# Patient Record
Sex: Male | Born: 1940
Health system: Southern US, Community
[De-identification: ages and names within clinical notes are randomized; demographics above are authoritative.]

## PROBLEM LIST (undated history)

## (undated) DIAGNOSIS — J449 Chronic obstructive pulmonary disease, unspecified: Secondary | ICD-10-CM

## (undated) DIAGNOSIS — I10 Essential (primary) hypertension: Secondary | ICD-10-CM

## (undated) DIAGNOSIS — N289 Disorder of kidney and ureter, unspecified: Secondary | ICD-10-CM

## (undated) DIAGNOSIS — C801 Malignant (primary) neoplasm, unspecified: Secondary | ICD-10-CM

## (undated) DIAGNOSIS — E785 Hyperlipidemia, unspecified: Secondary | ICD-10-CM

## (undated) DIAGNOSIS — K5790 Diverticulosis of intestine, part unspecified, without perforation or abscess without bleeding: Secondary | ICD-10-CM

## (undated) DIAGNOSIS — N200 Calculus of kidney: Secondary | ICD-10-CM

## (undated) DIAGNOSIS — C4490 Unspecified malignant neoplasm of skin, unspecified: Secondary | ICD-10-CM

## (undated) DIAGNOSIS — M1990 Unspecified osteoarthritis, unspecified site: Secondary | ICD-10-CM

## (undated) HISTORY — PX: SKIN BIOPSY: SHX1

## (undated) HISTORY — DX: Diverticulosis of intestine, part unspecified, without perforation or abscess without bleeding: K57.90

## (undated) HISTORY — PX: SKIN GRAFT: SHX250

## (undated) HISTORY — PX: HERNIA REPAIR: SHX51

## (undated) HISTORY — PX: PROSTATE ABLATION: SHX6042

---

## 1998-02-05 ENCOUNTER — Encounter: Admission: RE | Admit: 1998-02-05 | Discharge: 1998-02-05 | Payer: Self-pay | Admitting: Family Medicine

## 2001-12-06 ENCOUNTER — Encounter: Admission: RE | Admit: 2001-12-06 | Discharge: 2001-12-06 | Payer: Self-pay | Admitting: Family Medicine

## 2001-12-19 ENCOUNTER — Encounter: Admission: RE | Admit: 2001-12-19 | Discharge: 2001-12-19 | Payer: Self-pay | Admitting: Family Medicine

## 2002-03-01 ENCOUNTER — Encounter: Admission: RE | Admit: 2002-03-01 | Discharge: 2002-03-01 | Payer: Self-pay | Admitting: Family Medicine

## 2002-04-19 ENCOUNTER — Encounter: Admission: RE | Admit: 2002-04-19 | Discharge: 2002-04-19 | Payer: Self-pay | Admitting: Family Medicine

## 2002-04-30 ENCOUNTER — Encounter: Admission: RE | Admit: 2002-04-30 | Discharge: 2002-04-30 | Payer: Self-pay | Admitting: Sports Medicine

## 2002-06-11 ENCOUNTER — Encounter: Admission: RE | Admit: 2002-06-11 | Discharge: 2002-06-11 | Payer: Self-pay | Admitting: Sports Medicine

## 2002-07-19 ENCOUNTER — Encounter: Admission: RE | Admit: 2002-07-19 | Discharge: 2002-07-19 | Payer: Self-pay | Admitting: Family Medicine

## 2002-07-24 ENCOUNTER — Ambulatory Visit (HOSPITAL_COMMUNITY): Admission: RE | Admit: 2002-07-24 | Discharge: 2002-07-24 | Payer: Self-pay | Admitting: Family Medicine

## 2002-07-24 ENCOUNTER — Encounter: Payer: Self-pay | Admitting: Family Medicine

## 2002-09-12 ENCOUNTER — Encounter: Admission: RE | Admit: 2002-09-12 | Discharge: 2002-09-12 | Payer: Self-pay | Admitting: Family Medicine

## 2002-10-21 ENCOUNTER — Encounter: Admission: RE | Admit: 2002-10-21 | Discharge: 2002-10-21 | Payer: Self-pay | Admitting: Family Medicine

## 2003-01-16 ENCOUNTER — Encounter: Admission: RE | Admit: 2003-01-16 | Discharge: 2003-01-16 | Payer: Self-pay | Admitting: Family Medicine

## 2003-03-07 ENCOUNTER — Encounter: Admission: RE | Admit: 2003-03-07 | Discharge: 2003-03-07 | Payer: Self-pay | Admitting: Family Medicine

## 2003-07-29 ENCOUNTER — Ambulatory Visit (HOSPITAL_COMMUNITY): Admission: RE | Admit: 2003-07-29 | Discharge: 2003-07-29 | Payer: Self-pay | Admitting: Family Medicine

## 2003-07-29 ENCOUNTER — Encounter: Admission: RE | Admit: 2003-07-29 | Discharge: 2003-07-29 | Payer: Self-pay | Admitting: Family Medicine

## 2003-08-22 ENCOUNTER — Encounter: Admission: RE | Admit: 2003-08-22 | Discharge: 2003-08-22 | Payer: Self-pay | Admitting: Family Medicine

## 2003-08-27 ENCOUNTER — Ambulatory Visit (HOSPITAL_COMMUNITY): Admission: RE | Admit: 2003-08-27 | Discharge: 2003-08-27 | Payer: Self-pay | Admitting: Sports Medicine

## 2003-08-27 ENCOUNTER — Encounter: Admission: RE | Admit: 2003-08-27 | Discharge: 2003-08-27 | Payer: Self-pay | Admitting: Family Medicine

## 2003-09-03 ENCOUNTER — Encounter: Admission: RE | Admit: 2003-09-03 | Discharge: 2003-09-03 | Payer: Self-pay | Admitting: Family Medicine

## 2003-10-24 ENCOUNTER — Emergency Department (HOSPITAL_COMMUNITY): Admission: EM | Admit: 2003-10-24 | Discharge: 2003-10-24 | Payer: Self-pay | Admitting: Family Medicine

## 2004-02-02 ENCOUNTER — Ambulatory Visit: Payer: Self-pay | Admitting: Sports Medicine

## 2005-01-15 ENCOUNTER — Emergency Department (HOSPITAL_COMMUNITY): Admission: EM | Admit: 2005-01-15 | Discharge: 2005-01-15 | Payer: Self-pay | Admitting: Family Medicine

## 2005-01-22 ENCOUNTER — Emergency Department (HOSPITAL_COMMUNITY): Admission: EM | Admit: 2005-01-22 | Discharge: 2005-01-22 | Payer: Self-pay | Admitting: Family Medicine

## 2006-09-23 ENCOUNTER — Emergency Department (HOSPITAL_COMMUNITY): Admission: EM | Admit: 2006-09-23 | Discharge: 2006-09-23 | Payer: Self-pay | Admitting: Emergency Medicine

## 2007-05-27 ENCOUNTER — Emergency Department (HOSPITAL_COMMUNITY): Admission: EM | Admit: 2007-05-27 | Discharge: 2007-05-27 | Payer: Self-pay | Admitting: Family Medicine

## 2008-03-22 ENCOUNTER — Emergency Department (HOSPITAL_BASED_OUTPATIENT_CLINIC_OR_DEPARTMENT_OTHER): Admission: EM | Admit: 2008-03-22 | Discharge: 2008-03-22 | Payer: Self-pay | Admitting: Emergency Medicine

## 2008-07-19 ENCOUNTER — Emergency Department (HOSPITAL_BASED_OUTPATIENT_CLINIC_OR_DEPARTMENT_OTHER): Admission: EM | Admit: 2008-07-19 | Discharge: 2008-07-19 | Payer: Self-pay | Admitting: Emergency Medicine

## 2009-03-08 ENCOUNTER — Emergency Department (HOSPITAL_BASED_OUTPATIENT_CLINIC_OR_DEPARTMENT_OTHER): Admission: EM | Admit: 2009-03-08 | Discharge: 2009-03-08 | Payer: Self-pay | Admitting: Emergency Medicine

## 2009-03-08 ENCOUNTER — Ambulatory Visit: Payer: Self-pay | Admitting: Diagnostic Radiology

## 2009-03-09 ENCOUNTER — Ambulatory Visit: Payer: Self-pay | Admitting: Radiology

## 2009-03-09 ENCOUNTER — Emergency Department (HOSPITAL_BASED_OUTPATIENT_CLINIC_OR_DEPARTMENT_OTHER): Admission: EM | Admit: 2009-03-09 | Discharge: 2009-03-09 | Payer: Self-pay | Admitting: Emergency Medicine

## 2009-12-21 ENCOUNTER — Emergency Department (HOSPITAL_BASED_OUTPATIENT_CLINIC_OR_DEPARTMENT_OTHER)
Admission: EM | Admit: 2009-12-21 | Discharge: 2009-12-22 | Payer: Self-pay | Source: Home / Self Care | Admitting: Emergency Medicine

## 2010-03-30 LAB — CBC
HCT: 37.6 % — ABNORMAL LOW (ref 39.0–52.0)
Platelets: 136 10*3/uL — ABNORMAL LOW (ref 150–400)
RBC: 4.19 MIL/uL — ABNORMAL LOW (ref 4.22–5.81)
RDW: 12.4 % (ref 11.5–15.5)
WBC: 3.1 10*3/uL — ABNORMAL LOW (ref 4.0–10.5)

## 2010-03-30 LAB — DIFFERENTIAL
Basophils Absolute: 0 10*3/uL (ref 0.0–0.1)
Lymphocytes Relative: 48 % — ABNORMAL HIGH (ref 12–46)
Lymphs Abs: 1.5 10*3/uL (ref 0.7–4.0)
Monocytes Absolute: 0.4 10*3/uL (ref 0.1–1.0)
Neutro Abs: 1.1 10*3/uL — ABNORMAL LOW (ref 1.7–7.7)

## 2010-03-30 LAB — BASIC METABOLIC PANEL
BUN: 13 mg/dL (ref 6–23)
GFR calc Af Amer: >60 mL/min (ref >60)
GFR calc non Af Amer: >60 mL/min (ref >60)
Potassium: 3.7 mEq/L (ref 3.5–5.1)

## 2010-04-07 LAB — CBC
HCT: 38.7 % — ABNORMAL LOW (ref 39.0–52.0)
Hemoglobin: 13.6 g/dL (ref 13.0–17.0)
WBC: 4.6 10*3/uL (ref 4.0–10.5)

## 2010-04-07 LAB — DIFFERENTIAL
Basophils Absolute: 0 10*3/uL (ref 0.0–0.1)
Basophils Relative: 0 % (ref 0–1)
Monocytes Absolute: 0.6 10*3/uL (ref 0.1–1.0)
Neutro Abs: 3.3 10*3/uL (ref 1.7–7.7)
Neutrophils Relative %: 72 % (ref 43–77)

## 2010-04-07 LAB — COMPREHENSIVE METABOLIC PANEL
Alkaline Phosphatase: 49 U/L (ref 39–117)
BUN: 15 mg/dL (ref 6–23)
Chloride: 104 mEq/L (ref 96–112)
GFR calc non Af Amer: >60 mL/min (ref >60)
Glucose, Bld: 115 mg/dL — ABNORMAL HIGH (ref 70–99)
Potassium: 3.2 mEq/L — ABNORMAL LOW (ref 3.5–5.1)
Total Bilirubin: 0.8 mg/dL (ref 0.3–1.2)

## 2010-04-07 LAB — URINALYSIS, ROUTINE W REFLEX MICROSCOPIC
Protein, ur: 30 mg/dL — AB
Urobilinogen, UA: 1 mg/dL (ref 0.0–1.0)

## 2010-04-07 LAB — URINE MICROSCOPIC-ADD ON

## 2010-10-29 LAB — CBC
Hemoglobin: 14.7
MCHC: 34.7
RBC: 4.59
WBC: 5.4

## 2010-10-29 LAB — DIFFERENTIAL
Lymphocytes Relative: 38
Lymphs Abs: 2.1
Monocytes Absolute: 0.4
Monocytes Relative: 8
Neutro Abs: 2.8

## 2010-10-29 LAB — POCT URINALYSIS DIP (DEVICE)
Glucose, UA: NEGATIVE
Ketones, ur: NEGATIVE
Operator id: 235561
Specific Gravity, Urine: 1.02

## 2010-10-29 LAB — I-STAT 8, (EC8 V) (CONVERTED LAB)
BUN: 11
Chloride: 104
Hemoglobin: 15.6
Operator id: 235561
pCO2, Ven: 46.9

## 2012-05-11 ENCOUNTER — Emergency Department (HOSPITAL_BASED_OUTPATIENT_CLINIC_OR_DEPARTMENT_OTHER)
Admission: EM | Admit: 2012-05-11 | Discharge: 2012-05-11 | Disposition: A | Discharge status: Home / Self Care | Payer: Medicare Other | Attending: Emergency Medicine | Admitting: Emergency Medicine

## 2012-05-11 ENCOUNTER — Encounter (HOSPITAL_BASED_OUTPATIENT_CLINIC_OR_DEPARTMENT_OTHER): Payer: Self-pay | Admitting: Emergency Medicine

## 2012-05-11 ENCOUNTER — Ambulatory Visit (HOSPITAL_BASED_OUTPATIENT_CLINIC_OR_DEPARTMENT_OTHER)
Admit: 2012-05-11 | Discharge: 2012-05-11 | Disposition: A | Discharge status: Home / Self Care | Payer: Medicare Other | Source: Ambulatory Visit | Attending: Emergency Medicine | Admitting: Emergency Medicine

## 2012-05-11 ENCOUNTER — Other Ambulatory Visit (HOSPITAL_BASED_OUTPATIENT_CLINIC_OR_DEPARTMENT_OTHER): Payer: Self-pay | Admitting: Emergency Medicine

## 2012-05-11 DIAGNOSIS — Z87448 Personal history of other diseases of urinary system: Secondary | ICD-10-CM | POA: Insufficient documentation

## 2012-05-11 DIAGNOSIS — R9389 Abnormal findings on diagnostic imaging of other specified body structures: Secondary | ICD-10-CM | POA: Insufficient documentation

## 2012-05-11 DIAGNOSIS — R222 Localized swelling, mass and lump, trunk: Secondary | ICD-10-CM | POA: Insufficient documentation

## 2012-05-11 DIAGNOSIS — Z85828 Personal history of other malignant neoplasm of skin: Secondary | ICD-10-CM | POA: Insufficient documentation

## 2012-05-11 DIAGNOSIS — Z8739 Personal history of other diseases of the musculoskeletal system and connective tissue: Secondary | ICD-10-CM | POA: Insufficient documentation

## 2012-05-11 DIAGNOSIS — Z87442 Personal history of urinary calculi: Secondary | ICD-10-CM | POA: Insufficient documentation

## 2012-05-11 DIAGNOSIS — Z8639 Personal history of other endocrine, nutritional and metabolic disease: Secondary | ICD-10-CM | POA: Insufficient documentation

## 2012-05-11 DIAGNOSIS — I10 Essential (primary) hypertension: Secondary | ICD-10-CM | POA: Insufficient documentation

## 2012-05-11 DIAGNOSIS — Z862 Personal history of diseases of the blood and blood-forming organs and certain disorders involving the immune mechanism: Secondary | ICD-10-CM | POA: Insufficient documentation

## 2012-05-11 HISTORY — DX: Disorder of kidney and ureter, unspecified: N28.9

## 2012-05-11 HISTORY — DX: Unspecified osteoarthritis, unspecified site: M19.90

## 2012-05-11 HISTORY — DX: Malignant (primary) neoplasm, unspecified: C80.1

## 2012-05-11 HISTORY — DX: Hyperlipidemia, unspecified: E78.5

## 2012-05-11 HISTORY — DX: Calculus of kidney: N20.0

## 2012-05-11 HISTORY — DX: Unspecified malignant neoplasm of skin, unspecified: C44.90

## 2012-05-11 HISTORY — DX: Essential (primary) hypertension: I10

## 2012-05-11 NOTE — ED Provider Notes (Signed)
History     CSN: [id number]  Arrival date & time 05/11/12  0056   First MD Initiated Contact with Patient 05/11/12 0253      Chief Complaint  Patient presents with  . Lump on Back     (Consider location/radiation/quality/duration/timing/severity/associated sxs/prior treatment) HPI 72 year old male who discovered a knot beneath his left scapula yesterday. He is not sure if it just arose or if he just discovered it. The knot can be pushed up underneath his scapula and then by extending his left shoulder can be popped down below his scapula. It is not tender. It is firm and round. It is about the size of a golf ball. There is no associated chest pain or shortness of breath.  Past Medical History  Diagnosis Date  . Hypertension   . Hyperlipidemia   . Cancer   . Skin cancer   . Arthritis   . Osteoarthritis   . Renal disorder   . Kidney stones     Past Surgical History  Procedure Laterality Date  . Skin biopsy    . Hernia repair    . Prostate ablation      History reviewed. No pertinent family history.  History  Substance Use Topics  . Smoking status: Not on file  . Smokeless tobacco: Not on file  . Alcohol Use: No      Review of Systems  All other systems reviewed and are negative.    Allergies  Pneumococcal vaccines; Sulfa antibiotics; and Aspirin  Home Medications  No current outpatient prescriptions on file.  BP 144/96  Pulse 102  Temp(Src) 98.1 F (36.7 C) (Oral)  Resp 14  Ht 6\' 1"  (1.854 m)  Wt 187 lb (84.823 kg)  BMI 24.68 kg/m2  SpO2 97%  Physical Exam General: Well-developed, well-nourished male in no acute distress; appearance consistent with age of record HENT: normocephalic, atraumatic Eyes: pupils equal round and reactive to light; extraocular muscles intact Neck: supple Heart: regular rate and rhythm Lungs: clear to auscultation bilaterally Abdomen: soft; nondistended Back: Firm, near spherical, nontender mass inferior to left  scapula; mass can be manually "popped" out behind the scapula; with extension of the left arm at the shoulder the mass can be "popped" back down inferior to the scapula; mass is roughly the size of a golf ball Extremities: No deformity; full range of motion Neurologic: Awake, alert and oriented; motor function intact in all extremities and symmetric; no facial droop Skin: Warm and dry Psychiatric: Normal mood and affect    ED Course  Procedures (including critical care time)     MDM  Radiologist recommends ultrasound evaluation as an outpatient.        Hanley Seamen, MD 05/11/12 [phone]

## 2012-05-11 NOTE — ED Notes (Signed)
Pt states he noticed a big knot on his back underneath his left shoulder. Pt state he does not have any pain with the lump/knot.

## 2012-05-11 NOTE — ED Provider Notes (Signed)
Pt returned for ultrasound.  Ultrasound show 4.2 cm probable cystic mass.    Pt given referral to general surgeon at [facility] [place] for further evaluation/excision.     Lonia Skinner Shafter, PA-C 05/11/12 [phone]

## 2012-05-16 ENCOUNTER — Ambulatory Visit (INDEPENDENT_AMBULATORY_CARE_PROVIDER_SITE_OTHER): Payer: Medicare Other | Admitting: General Surgery

## 2012-05-31 ENCOUNTER — Encounter (INDEPENDENT_AMBULATORY_CARE_PROVIDER_SITE_OTHER): Payer: Self-pay | Admitting: General Surgery

## 2013-06-23 ENCOUNTER — Encounter (HOSPITAL_BASED_OUTPATIENT_CLINIC_OR_DEPARTMENT_OTHER): Payer: Self-pay | Admitting: Emergency Medicine

## 2013-06-23 ENCOUNTER — Emergency Department (HOSPITAL_BASED_OUTPATIENT_CLINIC_OR_DEPARTMENT_OTHER)
Admission: EM | Admit: 2013-06-23 | Discharge: 2013-06-24 | Disposition: A | Discharge status: Home / Self Care | Payer: Medicare Other | Attending: Emergency Medicine | Admitting: Emergency Medicine

## 2013-06-23 ENCOUNTER — Emergency Department (HOSPITAL_BASED_OUTPATIENT_CLINIC_OR_DEPARTMENT_OTHER): Payer: Medicare Other

## 2013-06-23 DIAGNOSIS — Z8739 Personal history of other diseases of the musculoskeletal system and connective tissue: Secondary | ICD-10-CM | POA: Insufficient documentation

## 2013-06-23 DIAGNOSIS — R1013 Epigastric pain: Secondary | ICD-10-CM | POA: Insufficient documentation

## 2013-06-23 DIAGNOSIS — Z87891 Personal history of nicotine dependence: Secondary | ICD-10-CM | POA: Insufficient documentation

## 2013-06-23 DIAGNOSIS — R109 Unspecified abdominal pain: Secondary | ICD-10-CM

## 2013-06-23 DIAGNOSIS — Z87442 Personal history of urinary calculi: Secondary | ICD-10-CM | POA: Insufficient documentation

## 2013-06-23 DIAGNOSIS — R079 Chest pain, unspecified: Secondary | ICD-10-CM | POA: Insufficient documentation

## 2013-06-23 DIAGNOSIS — Z85828 Personal history of other malignant neoplasm of skin: Secondary | ICD-10-CM | POA: Insufficient documentation

## 2013-06-23 DIAGNOSIS — I10 Essential (primary) hypertension: Secondary | ICD-10-CM | POA: Insufficient documentation

## 2013-06-23 DIAGNOSIS — Z862 Personal history of diseases of the blood and blood-forming organs and certain disorders involving the immune mechanism: Secondary | ICD-10-CM | POA: Insufficient documentation

## 2013-06-23 DIAGNOSIS — Z87448 Personal history of other diseases of urinary system: Secondary | ICD-10-CM | POA: Insufficient documentation

## 2013-06-23 DIAGNOSIS — Z8639 Personal history of other endocrine, nutritional and metabolic disease: Secondary | ICD-10-CM | POA: Insufficient documentation

## 2013-06-23 DIAGNOSIS — R110 Nausea: Secondary | ICD-10-CM | POA: Insufficient documentation

## 2013-06-23 LAB — BASIC METABOLIC PANEL
BUN: 13 mg/dL (ref 6–23)
CALCIUM: 9.2 mg/dL (ref 8.4–10.5)
CO2: 24 mEq/L (ref 19–32)
Chloride: 105 mEq/L (ref 96–112)
Creatinine, Ser: 1 mg/dL (ref 0.50–1.35)
GFR calc Af Amer: 85 mL/min — ABNORMAL LOW (ref >90)
GFR, EST NON AFRICAN AMERICAN: 73 mL/min — AB (ref >90)
GLUCOSE: 106 mg/dL — AB (ref 70–99)
Potassium: 4.1 mEq/L (ref 3.7–5.3)
Sodium: 141 mEq/L (ref 137–147)

## 2013-06-23 LAB — TROPONIN I: Troponin I: <0.3 ng/mL (ref <0.30)

## 2013-06-23 LAB — URINALYSIS, ROUTINE W REFLEX MICROSCOPIC
Bilirubin Urine: NEGATIVE
GLUCOSE, UA: NEGATIVE mg/dL
Hgb urine dipstick: NEGATIVE
Ketones, ur: NEGATIVE mg/dL
Leukocytes, UA: NEGATIVE
Nitrite: NEGATIVE
PROTEIN: NEGATIVE mg/dL
Specific Gravity, Urine: 1.016 (ref 1.005–1.030)
UROBILINOGEN UA: 0.2 mg/dL (ref 0.0–1.0)
pH: 7.5 (ref 5.0–8.0)

## 2013-06-23 LAB — I-STAT CG4 LACTIC ACID, ED: Lactic Acid, Venous: 1.08 mmol/L (ref 0.5–2.2)

## 2013-06-23 LAB — CBC WITH DIFFERENTIAL/PLATELET
BASOS ABS: 0 10*3/uL (ref 0.0–0.1)
Basophils Relative: 1 % (ref 0–1)
EOS ABS: 0.2 10*3/uL (ref 0.0–0.7)
EOS PCT: 3 % (ref 0–5)
HEMATOCRIT: 40.7 % (ref 39.0–52.0)
Hemoglobin: 14.5 g/dL (ref 13.0–17.0)
LYMPHS PCT: 43 % (ref 12–46)
Lymphs Abs: 2.4 10*3/uL (ref 0.7–4.0)
MCH: 33.2 pg (ref 26.0–34.0)
MCHC: 35.6 g/dL (ref 30.0–36.0)
MCV: 93.1 fL (ref 78.0–100.0)
MONO ABS: 0.7 10*3/uL (ref 0.1–1.0)
Monocytes Relative: 12 % (ref 3–12)
Neutro Abs: 2.3 10*3/uL (ref 1.7–7.7)
Neutrophils Relative %: 41 % — ABNORMAL LOW (ref 43–77)
PLATELETS: 207 10*3/uL (ref 150–400)
RBC: 4.37 MIL/uL (ref 4.22–5.81)
RDW: 12.5 % (ref 11.5–15.5)
WBC: 5.6 10*3/uL (ref 4.0–10.5)

## 2013-06-23 MED ORDER — MORPHINE SULFATE 4 MG/ML IJ SOLN
4.0000 mg | Freq: Once | INTRAMUSCULAR | Status: AC
  Administered 2013-06-23: 4 mg via INTRAVENOUS
  Filled 2013-06-23: qty 1

## 2013-06-23 MED ORDER — GI COCKTAIL ~~LOC~~
30.0000 mL | Freq: Once | ORAL | Status: AC
  Administered 2013-06-23: 30 mL via ORAL
  Filled 2013-06-23: qty 30

## 2013-06-23 MED ORDER — ONDANSETRON HCL 4 MG/2ML IJ SOLN
4.0000 mg | Freq: Once | INTRAMUSCULAR | Status: AC
  Administered 2013-06-23: 4 mg via INTRAVENOUS
  Filled 2013-06-23: qty 2

## 2013-06-23 MED ORDER — IOHEXOL 300 MG/ML  SOLN
50.0000 mL | Freq: Once | INTRAMUSCULAR | Status: AC | PRN
  Administered 2013-06-23: 50 mL via ORAL

## 2013-06-23 MED ORDER — IOHEXOL 300 MG/ML  SOLN
100.0000 mL | Freq: Once | INTRAMUSCULAR | Status: AC | PRN
  Administered 2013-06-23: 100 mL via INTRAVENOUS

## 2013-06-23 NOTE — ED Notes (Signed)
C/o body aches, headache, abdominal pain, and feeling of bloating x two days.  Denies vomiting, diarrhea.  C/o some chest pain, nausea.

## 2013-06-23 NOTE — ED Provider Notes (Signed)
CSN: [id number]     Arrival date & time 06/23/13  2025 History   First MD Initiated Contact with Patient 06/23/13 2133     Chief Complaint  Patient presents with  . Abdominal Pain  . Chest Pain     (Consider location/radiation/quality/duration/timing/severity/associated sxs/prior Treatment) Patient is a 73 y.o. male presenting with abdominal pain and chest pain.  Abdominal Pain Pain location:  Epigastric Pain quality: sharp   Pain radiates to:  Does not radiate Pain severity:  Severe Onset quality:  Gradual Duration:  3 days Timing:  Constant Progression:  Unchanged Chronicity:  New Relieved by:  Nothing Worsened by:  Movement Ineffective treatments:  None tried Associated symptoms: chest pain and nausea   Associated symptoms: no constipation, no diarrhea and no vomiting   Chest Pain Associated symptoms: abdominal pain and nausea   Associated symptoms: not vomiting     Past Medical History  Diagnosis Date  . Hypertension   . Hyperlipidemia   . Cancer   . Skin cancer   . Arthritis   . Osteoarthritis   . Renal disorder   . Kidney stones    Past Surgical History  Procedure Laterality Date  . Skin biopsy    . Hernia repair    . Prostate ablation     No family history on file. History  Substance Use Topics  . Smoking status: Former Research scientist (life sciences)  . Smokeless tobacco: Not on file  . Alcohol Use: No    Review of Systems  Cardiovascular: Positive for chest pain.  Gastrointestinal: Positive for nausea and abdominal pain. Negative for vomiting, diarrhea and constipation.  All other systems reviewed and are negative.     Allergies  Pneumococcal vaccines; Sulfa antibiotics; and Aspirin  Home Medications   Prior to Admission medications   Not on File   BP 130/97  Pulse 92  Temp(Src) 97.6 F (36.4 C) (Oral)  Resp 20  Ht 6' (1.829 m)  Wt 196 lb (88.905 kg)  BMI 26.58 kg/m2  SpO2 100% Physical Exam  Nursing note and vitals reviewed. Constitutional: He is  oriented to person, place, and time. He appears well-developed and well-nourished. No distress.  HENT:  Head: Normocephalic and atraumatic.  Mouth/Throat: Oropharynx is clear and moist.  Eyes: Conjunctivae are normal. Pupils are equal, round, and reactive to light. No scleral icterus.  Neck: Neck supple.  Cardiovascular: Normal rate, regular rhythm, normal heart sounds and intact distal pulses.   No murmur heard. Pulmonary/Chest: Effort normal and breath sounds normal. No stridor. No respiratory distress. He has no wheezes. He has no rales.  Abdominal: Soft. He exhibits no distension. There is tenderness in the epigastric area. There is no rigidity, no rebound and no guarding.  Musculoskeletal: Normal range of motion. He exhibits no edema.  Neurological: He is alert and oriented to person, place, and time.  Skin: Skin is warm and dry. No rash noted.  Psychiatric: He has a normal mood and affect. His behavior is normal.    ED Course  Procedures (including critical care time) Labs Review Labs Reviewed  CBC WITH DIFFERENTIAL - Abnormal; Notable for the following:    Neutrophils Relative % 41 (*)    All other components within normal limits  BASIC METABOLIC PANEL - Abnormal; Notable for the following:    Glucose, Bld 106 (*)    GFR calc non Af Amer 73 (*)    GFR calc Af Amer 85 (*)    All other components within normal limits  URINALYSIS,  ROUTINE W REFLEX MICROSCOPIC  TROPONIN I  I-STAT CG4 LACTIC ACID, ED    Imaging Review Ct Abdomen Pelvis W Contrast  06/23/2013   CLINICAL DATA:  Body aches, headache, abdominal pain, bloating, chest pain, and nausea.  EXAM: CT ABDOMEN AND PELVIS WITH CONTRAST  TECHNIQUE: Multidetector CT imaging of the abdomen and pelvis was performed using the standard protocol following bolus administration of intravenous contrast.  CONTRAST:  125mL OMNIPAQUE IOHEXOL 300 MG/ML  SOLN  COMPARISON:  03/09/2009  FINDINGS: Patchy atelectasis or infiltration in the lung  bases.  Tiny sub cm low-attenuation lesions in the liver similar to previous study, likely representing cysts. The gallbladder, spleen, pancreas, adrenal glands, inferior vena cava, and retroperitoneal lymph nodes are unremarkable. There is parenchymal scarring in the midpole of the right kidney. No hydronephrosis in either kidney. Calcification of the abdominal aorta without aneurysm. Stomach and small bowel are not abnormally distended and without wall thickening. Diffusely stool-filled colon without distention. No free air or free fluid in the abdomen. Abdominal wall musculature appears intact.  Pelvis: The prostate gland is enlarged, measuring 5.9 x 5 cm. Bladder wall is not thickened. There is a diverticulum arising from the left posterior bladder wall. No free or loculated pelvic fluid collections. No significant pelvic lymphadenopathy. Appendix is normal. Diverticula in the sigmoid colon without evidence of diverticulitis. Decompression of the sigmoid colon is probably due to under distention. Degenerative changes in the lumbar spine. No destructive bone lesions appreciated.  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 Sigmoid colonic diverticulosis without inflammatory change. Stable appearance of low-attenuation liver lesions, likely cysts. No significant change since prior study.   Electronically Signed   By: Lucienne Capers M.D.   On: 06/23/2013 23:44   All radiology studies independently viewed by me.      EKG Interpretation   Date/Time:  Sunday June 23 2013 20:47:41 EDT Ventricular Rate:  87 PR Interval:  166 QRS Duration: 94 QT Interval:  370 QTC Calculation: 445 R Axis:   -9 Text Interpretation:  Sinus rhythm with marked sinus arrhythmia with  frequent Premature ventricular complexes Left anterior fasicular block No  significant change was found Confirmed by [facility]  MD, TREY ([id number]) on  06/23/2013 10:51:42 PM      MDM   Final diagnoses:   Abdominal pain    73 year old male with epigastric abdominal pain for the last 2-3 days. Well-appearing, nontoxic. Abdomen soft but tender in his epigastrium. He also endorses some malaise and some mild chest pain. I do not think this represents ACS given the duration of symptoms, negative EKG, and negative troponin. Remainder of his labs are unremarkable. Plan to check CT of his abdomen and pelvis.  Labs unremarkable.  CT unchanged from prior.  Pain better after morphine and GI cocktail.  Pain possibly secondary to gastritis.  Advised adding zantac.  Advised PCP and GI follow up.  Given return precautions.   Houston Siren III, MD 06/24/13 [phone]

## 2013-06-24 MED ORDER — RANITIDINE HCL 75 MG PO TABS: 75.0000 mg | ORAL_TABLET | Freq: Two times a day (BID) | ORAL | Status: DC

## 2013-06-24 NOTE — Discharge Instructions (Signed)
Abdominal Pain, Adult °Many things can cause abdominal pain. Usually, abdominal pain is not caused by a disease and will improve without treatment. It can often be observed and treated at home. Your health care provider will do a physical exam and possibly order blood tests and X-rays to help determine the seriousness of your pain. However, in many cases, more time must pass before a clear cause of the pain can be found. Before that point, your health care provider may not know if you need more testing or further treatment. °HOME CARE INSTRUCTIONS  °Monitor your abdominal pain for any changes. The following actions may help to alleviate any discomfort you are experiencing: °· Only take over-the-counter or prescription medicines as directed by your health care provider. °· Do not take laxatives unless directed to do so by your health care provider. °· Try a clear liquid diet (broth, tea, or water) as directed by your health care provider. Slowly move to a bland diet as tolerated. °SEEK MEDICAL CARE IF: °· You have unexplained abdominal pain. °· You have abdominal pain associated with nausea or diarrhea. °· You have pain when you urinate or have a bowel movement. °· You experience abdominal pain that wakes you in the night. °· You have abdominal pain that is worsened or improved by eating food. °· You have abdominal pain that is worsened with eating fatty foods. °SEEK IMMEDIATE MEDICAL CARE IF:  °· Your pain does not go away within 2 hours. °· You have a fever. °· You keep throwing up (vomiting). °· Your pain is felt only in portions of the abdomen, such as the right side or the left lower portion of the abdomen. °· You pass bloody or black tarry stools. °MAKE SURE YOU: °· Understand these instructions.   °· Will watch your condition.   °· Will get help right away if you are not doing well or get worse.   °Document Released: 10/13/2004 Document Revised: 10/24/2012 Document Reviewed: 09/12/2012 °[employer]® Patient  Information ©2014 [employer]. ° °

## 2013-08-22 ENCOUNTER — Ambulatory Visit: Payer: Medicare Other | Admitting: Internal Medicine

## 2013-10-21 ENCOUNTER — Encounter (HOSPITAL_BASED_OUTPATIENT_CLINIC_OR_DEPARTMENT_OTHER): Payer: Self-pay | Admitting: Emergency Medicine

## 2013-10-21 ENCOUNTER — Emergency Department (HOSPITAL_BASED_OUTPATIENT_CLINIC_OR_DEPARTMENT_OTHER)
Admission: EM | Admit: 2013-10-21 | Discharge: 2013-10-21 | Discharge status: Against Medical Advice | Payer: Medicare Other | Attending: Emergency Medicine | Admitting: Emergency Medicine

## 2013-10-21 DIAGNOSIS — M549 Dorsalgia, unspecified: Secondary | ICD-10-CM | POA: Diagnosis present

## 2013-10-21 DIAGNOSIS — I10 Essential (primary) hypertension: Secondary | ICD-10-CM | POA: Diagnosis not present

## 2013-10-21 DIAGNOSIS — M545 Low back pain: Secondary | ICD-10-CM | POA: Insufficient documentation

## 2013-10-21 NOTE — ED Notes (Signed)
Pt c/o lower back pain which radiates won left leg x 2 days

## 2013-12-20 ENCOUNTER — Emergency Department (HOSPITAL_BASED_OUTPATIENT_CLINIC_OR_DEPARTMENT_OTHER)
Admission: EM | Admit: 2013-12-20 | Discharge: 2013-12-21 | Disposition: A | Discharge status: Home / Self Care | Payer: Medicare Other | Attending: Emergency Medicine | Admitting: Emergency Medicine

## 2013-12-20 ENCOUNTER — Encounter (HOSPITAL_BASED_OUTPATIENT_CLINIC_OR_DEPARTMENT_OTHER): Payer: Self-pay

## 2013-12-20 DIAGNOSIS — Z872 Personal history of diseases of the skin and subcutaneous tissue: Secondary | ICD-10-CM | POA: Insufficient documentation

## 2013-12-20 DIAGNOSIS — M79651 Pain in right thigh: Secondary | ICD-10-CM | POA: Diagnosis not present

## 2013-12-20 DIAGNOSIS — M79604 Pain in right leg: Secondary | ICD-10-CM | POA: Diagnosis present

## 2013-12-20 DIAGNOSIS — Z9889 Other specified postprocedural states: Secondary | ICD-10-CM | POA: Diagnosis not present

## 2013-12-20 DIAGNOSIS — Z79899 Other long term (current) drug therapy: Secondary | ICD-10-CM | POA: Insufficient documentation

## 2013-12-20 DIAGNOSIS — Z8719 Personal history of other diseases of the digestive system: Secondary | ICD-10-CM | POA: Insufficient documentation

## 2013-12-20 DIAGNOSIS — Z87448 Personal history of other diseases of urinary system: Secondary | ICD-10-CM | POA: Insufficient documentation

## 2013-12-20 DIAGNOSIS — Z87442 Personal history of urinary calculi: Secondary | ICD-10-CM | POA: Diagnosis not present

## 2013-12-20 DIAGNOSIS — Z87891 Personal history of nicotine dependence: Secondary | ICD-10-CM | POA: Insufficient documentation

## 2013-12-20 DIAGNOSIS — R21 Rash and other nonspecific skin eruption: Secondary | ICD-10-CM | POA: Diagnosis not present

## 2013-12-20 DIAGNOSIS — R1032 Left lower quadrant pain: Secondary | ICD-10-CM | POA: Diagnosis not present

## 2013-12-20 DIAGNOSIS — Z8639 Personal history of other endocrine, nutritional and metabolic disease: Secondary | ICD-10-CM | POA: Diagnosis not present

## 2013-12-20 DIAGNOSIS — I10 Essential (primary) hypertension: Secondary | ICD-10-CM | POA: Diagnosis not present

## 2013-12-20 DIAGNOSIS — Z85828 Personal history of other malignant neoplasm of skin: Secondary | ICD-10-CM | POA: Diagnosis not present

## 2013-12-20 NOTE — ED Notes (Signed)
Pt reports right leg pain for 1 week and L groin pain for 3-4 days.  Reports "it keeps giving out on me" but denies falls.

## 2013-12-21 DIAGNOSIS — M79651 Pain in right thigh: Secondary | ICD-10-CM | POA: Diagnosis not present

## 2013-12-21 MED ORDER — HYDROCODONE-ACETAMINOPHEN 5-325 MG PO TABS
1.0000 | ORAL_TABLET | Freq: Once | ORAL | Status: AC
  Administered 2013-12-21: 1 via ORAL

## 2013-12-21 MED ORDER — HYDROCODONE-ACETAMINOPHEN 5-325 MG PO TABS: 1.0000 | ORAL_TABLET | Freq: Four times a day (QID) | ORAL | Status: DC | PRN

## 2013-12-21 NOTE — ED Provider Notes (Signed)
CSN: [id number]     Arrival date & time 12/20/13  2154 History   First MD Initiated Contact with Patient 12/21/13 0111     Chief Complaint  Patient presents with  . Leg Pain     (Consider location/radiation/quality/duration/timing/severity/associated sxs/prior Treatment) HPI  This is a 73 year old male with one-week history of pain in his right side. The pain originates in about the location of his right greater trochanter and radiates down the right side of his thigh to his knee. He began as a burning sensation and then became a frank pain. He rates it an 8 out of 10. It is worse with movement or ambulation. He states he is had difficulty ambulating but has not actually fallen. He is also having some pain in his left groin. This pain is intermittent and worse with palpation. He has a history of right herniorrhaphy. He is also having a two-day history of erythematous plaques on his hands (dorsal and palmar) that break down and become painful. He has an appointment with his dermatologist on the 10th.  Past Medical History  Diagnosis Date  . Hypertension   . Hyperlipidemia   . Cancer   . Skin cancer   . Arthritis   . Osteoarthritis   . Renal disorder   . Kidney stones   . Diverticulosis    Past Surgical History  Procedure Laterality Date  . Skin biopsy    . Hernia repair    . Prostate ablation     No family history on file. History  Substance Use Topics  . Smoking status: Former Research scientist (life sciences)  . Smokeless tobacco: Not on file  . Alcohol Use: No    Review of Systems  All other systems reviewed and are negative.   Allergies  Pneumococcal vaccines; Sulfa antibiotics; and Aspirin  Home Medications   Prior to Admission medications   Medication Sig Start Date End Date Taking? Authorizing Provider  ranitidine (ZANTAC) 75 MG tablet Take 1 tablet (75 mg total) by mouth 2 (two) times daily. 06/24/13   Artis Delay, MD   BP 126/78 mmHg  Pulse 103  Temp(Src) 98.4 F (36.9 C) (Oral)   Resp 18  Ht 6\' 2"  (1.88 m)  Wt 200 lb (90.719 kg)  BMI 25.67 kg/m2  SpO2 98%   Physical Exam  General: Well-developed, well-nourished male in no acute distress; appearance consistent with age of record HENT: normocephalic; atraumatic; surgical changes to left side of face Eyes: pupils equal, round and reactive to light; extraocular muscles intact Neck: supple Heart: regular rate and rhythm Lungs: clear to auscultation bilaterally Abdomen: soft; nondistended; nontender; no masses or hepatosplenomegaly; bowel sounds present GU: Tanner 5 male, uncircumcised; no hernia palpated in lying or standing positions; tenderness on deep palpation of inguinal region Extremities: No deformity; full range of motion; pulses normal; tenderness of right greater trochanter and right lateral thigh Neurologic: Awake, alert and oriented; motor function intact in all extremities and symmetric; no facial droop Skin: Warm and dry; rash to hands as shown:   Psychiatric: Normal mood and affect    ED Course  Procedures (including critical care time)   MDM  Pain could represent trochanteric bursitis or iliotibial band syndrome on the right. The cause of left groin pain is not obvious. He could have a hernia that protrudes intermittently though known was palpated on exam.  Wynetta Fines, MD 12/21/13 [phone]

## 2014-04-05 ENCOUNTER — Emergency Department (HOSPITAL_BASED_OUTPATIENT_CLINIC_OR_DEPARTMENT_OTHER)
Admission: EM | Admit: 2014-04-05 | Discharge: 2014-04-05 | Discharge status: Against Medical Advice | Payer: Medicare Other | Attending: Emergency Medicine | Admitting: Emergency Medicine

## 2014-04-05 ENCOUNTER — Emergency Department (HOSPITAL_BASED_OUTPATIENT_CLINIC_OR_DEPARTMENT_OTHER): Payer: Medicare Other

## 2014-04-05 ENCOUNTER — Encounter (HOSPITAL_BASED_OUTPATIENT_CLINIC_OR_DEPARTMENT_OTHER): Payer: Self-pay | Admitting: Emergency Medicine

## 2014-04-05 DIAGNOSIS — Z87442 Personal history of urinary calculi: Secondary | ICD-10-CM | POA: Diagnosis not present

## 2014-04-05 DIAGNOSIS — I10 Essential (primary) hypertension: Secondary | ICD-10-CM | POA: Diagnosis not present

## 2014-04-05 DIAGNOSIS — R10819 Abdominal tenderness, unspecified site: Secondary | ICD-10-CM | POA: Diagnosis not present

## 2014-04-05 DIAGNOSIS — Z87891 Personal history of nicotine dependence: Secondary | ICD-10-CM | POA: Insufficient documentation

## 2014-04-05 DIAGNOSIS — Z8639 Personal history of other endocrine, nutritional and metabolic disease: Secondary | ICD-10-CM | POA: Insufficient documentation

## 2014-04-05 DIAGNOSIS — R079 Chest pain, unspecified: Secondary | ICD-10-CM | POA: Diagnosis present

## 2014-04-05 DIAGNOSIS — Z8719 Personal history of other diseases of the digestive system: Secondary | ICD-10-CM | POA: Insufficient documentation

## 2014-04-05 DIAGNOSIS — Z85828 Personal history of other malignant neoplasm of skin: Secondary | ICD-10-CM | POA: Diagnosis not present

## 2014-04-05 DIAGNOSIS — M1990 Unspecified osteoarthritis, unspecified site: Secondary | ICD-10-CM | POA: Diagnosis not present

## 2014-04-05 DIAGNOSIS — Z79899 Other long term (current) drug therapy: Secondary | ICD-10-CM | POA: Diagnosis not present

## 2014-04-05 LAB — TROPONIN I: Troponin I: <0.03 ng/mL (ref <0.031)

## 2014-04-05 LAB — CBC WITH DIFFERENTIAL/PLATELET
BASOS PCT: 0 % (ref 0–1)
Band Neutrophils: 1 % (ref 0–10)
Basophils Absolute: 0 10*3/uL (ref 0.0–0.1)
Eosinophils Absolute: 0.1 10*3/uL (ref 0.0–0.7)
Eosinophils Relative: 1 % (ref 0–5)
HCT: 42.3 % (ref 39.0–52.0)
HEMOGLOBIN: 14.4 g/dL (ref 13.0–17.0)
Lymphocytes Relative: 26 % (ref 12–46)
Lymphs Abs: 1.7 10*3/uL (ref 0.7–4.0)
MCH: 33.3 pg (ref 26.0–34.0)
MCHC: 34 g/dL (ref 30.0–36.0)
MCV: 97.9 fL (ref 78.0–100.0)
MONO ABS: 1.1 10*3/uL — AB (ref 0.1–1.0)
Monocytes Relative: 16 % — ABNORMAL HIGH (ref 3–12)
Neutro Abs: 3.8 10*3/uL (ref 1.7–7.7)
Neutrophils Relative %: 56 % (ref 43–77)
Platelets: 224 10*3/uL (ref 150–400)
RBC: 4.32 MIL/uL (ref 4.22–5.81)
RDW: 13 % (ref 11.5–15.5)
WBC: 6.7 10*3/uL (ref 4.0–10.5)

## 2014-04-05 LAB — COMPREHENSIVE METABOLIC PANEL
ALK PHOS: 55 U/L (ref 39–117)
ALT: 25 U/L (ref 0–53)
ANION GAP: 8 (ref 5–15)
AST: 32 U/L (ref 0–37)
Albumin: 4.1 g/dL (ref 3.5–5.2)
BILIRUBIN TOTAL: 0.8 mg/dL (ref 0.3–1.2)
BUN: 13 mg/dL (ref 6–23)
CALCIUM: 8.8 mg/dL (ref 8.4–10.5)
CO2: 28 mmol/L (ref 19–32)
CREATININE: 0.76 mg/dL (ref 0.50–1.35)
Chloride: 102 mmol/L (ref 96–112)
GFR calc Af Amer: >90 mL/min (ref >90)
GFR calc non Af Amer: 89 mL/min — ABNORMAL LOW (ref >90)
GLUCOSE: 102 mg/dL — AB (ref 70–99)
Potassium: 3.6 mmol/L (ref 3.5–5.1)
Sodium: 138 mmol/L (ref 135–145)
Total Protein: 7.3 g/dL (ref 6.0–8.3)

## 2014-04-05 LAB — LIPASE, BLOOD: LIPASE: 45 U/L (ref 11–59)

## 2014-04-05 MED ORDER — IOHEXOL 300 MG/ML  SOLN
50.0000 mL | Freq: Once | INTRAMUSCULAR | Status: AC | PRN
  Administered 2014-04-05: 50 mL via ORAL

## 2014-04-05 MED ORDER — IOHEXOL 300 MG/ML  SOLN
100.0000 mL | Freq: Once | INTRAMUSCULAR | Status: AC | PRN
  Administered 2014-04-05: 100 mL via INTRAVENOUS

## 2014-04-05 MED ORDER — GI COCKTAIL ~~LOC~~
30.0000 mL | Freq: Once | ORAL | Status: AC
Start: 2014-04-05 — End: 2014-04-05
  Administered 2014-04-05: 30 mL via ORAL
  Filled 2014-04-05: qty 30

## 2014-04-05 MED ORDER — ASPIRIN 81 MG PO CHEW
324.0000 mg | CHEWABLE_TABLET | Freq: Once | ORAL | Status: AC
  Administered 2014-04-05: 324 mg via ORAL
  Filled 2014-04-05: qty 4

## 2014-04-05 NOTE — ED Notes (Signed)
Pt reports chest pain onset 0400 today sharp mid chest with mild SOB

## 2014-04-05 NOTE — ED Provider Notes (Signed)
CSN: [id number]     Arrival date & time 04/05/14  1933 History   This chart was scribed for No att. providers found by Edison Simon, ED Scribe. This patient was seen in room MHOTF/OTF and the patient's care was started at 8:09 PM.    Chief Complaint  Patient presents with  . Chest Pain   The history is provided by the patient. No language interpreter was used.    HPI Comments: Casey Moreno is a 74 y.o. male who presents to the Emergency Department complaining of central chest pain, waking him at 0400 this morning with associated diaphoresis. He states he is not sweating now, but chest pain has been intermittent all day, lasting 15 minutes at a time. He states pain does not radiate anywhere else. He describes it as sharp and aching. He states nothing makes chest pain better or worse. He reports associated SOB, feeling hot and sweaty, nausea, abdominal pain, and decreased appeite. He describes pain "as if someone beat me;" he states it is intermittent and comes with chest pain, and it also began at 0400 with chest pain. He states he does not have chest pain or abdominal pain at this time, but his abdomen is tender. He has not used any medication for it at home, he states ASA makes him jittery. He states he has not had similar symptoms before.  He denies any cardiac history. He does have history of GERD but states this feels different. He also has Hx of HTN and HLD. He had a negative treadmill stress test last summer at [facility]. He states he still has appendix and gallbladder. He denies dysuria or hematuria.  PCP: Verdell Carmine., MD at [facility] No cardiologist, per patient  Past Medical History  Diagnosis Date  . Hypertension   . Hyperlipidemia   . Cancer   . Skin cancer   . Arthritis   . Osteoarthritis   . Renal disorder   . Kidney stones   . Diverticulosis    Past Surgical History  Procedure Laterality Date  . Skin biopsy    . Hernia repair    .  Prostate ablation     History reviewed. No pertinent family history. History  Substance Use Topics  . Smoking status: Former Research scientist (life sciences)  . Smokeless tobacco: Not on file  . Alcohol Use: No    Review of Systems A complete 10 system review of systems was obtained and all systems are negative except as noted in the HPI and PMH.    Allergies  Pneumococcal vaccines; Sulfa antibiotics; and Aspirin  Home Medications   Prior to Admission medications   Medication Sig Start Date End Date Taking? Authorizing Provider  HYDROcodone-acetaminophen (NORCO/VICODIN) 5-325 MG per tablet Take 1-2 tablets by mouth every 6 (six) hours as needed (for pain; may cause constipation). 12/21/13   John Molpus, MD  ranitidine (ZANTAC) 75 MG tablet Take 1 tablet (75 mg total) by mouth 2 (two) times daily. 06/24/13   Serita Grit, MD   BP 150/91 mmHg  Pulse 81  Temp(Src) 98.2 F (36.8 C) (Oral)  Resp 18  Wt 200 lb (90.719 kg)  SpO2 96% Physical Exam  Constitutional: He is oriented to person, place, and time. He appears well-developed and well-nourished. No distress.  HENT:  Head: Normocephalic and atraumatic.  Mouth/Throat: Oropharynx is clear and moist. No oropharyngeal exudate.  Eyes: Conjunctivae and EOM are normal. Pupils are equal, round, and reactive to light.  Neck: Normal range of  motion. Neck supple.  No meningismus.  Cardiovascular: Normal rate, regular rhythm, normal heart sounds and intact distal pulses.   No murmur heard. Intact distal pulses  Pulmonary/Chest: Effort normal and breath sounds normal. No respiratory distress. He exhibits no tenderness.  Abdominal: Soft. There is tenderness (diffuse abdominal tenderness). There is no rebound and no guarding.  Musculoskeletal: Normal range of motion. He exhibits no edema or tenderness.  Neurological: He is alert and oriented to person, place, and time. No cranial nerve deficit. He exhibits normal muscle tone. Coordination normal.  No ataxia on finger  to nose bilaterally. No pronator drift. 5/5 strength throughout. CN 2-12 intact. Negative Romberg. Equal grip strength. Sensation intact. Gait is normal.   Skin: Skin is warm.  Psychiatric: He has a normal mood and affect. His behavior is normal.  Nursing note and vitals reviewed.   ED Course  Procedures (including critical care time)  DIAGNOSTIC STUDIES: Oxygen Saturation is 99% on room air, normal by my interpretation.    COORDINATION OF CARE: 8:18 PM Discussed treatment plan with patient at beside, the patient agrees with the plan and has no further questions at this time.   Labs Review Labs Reviewed  CBC WITH DIFFERENTIAL/PLATELET - Abnormal; Notable for the following:    Monocytes Relative 16 (*)    Monocytes Absolute 1.1 (*)    All other components within normal limits  COMPREHENSIVE METABOLIC PANEL - Abnormal; Notable for the following:    Glucose, Bld 102 (*)    GFR calc non Af Amer 89 (*)    All other components within normal limits  LIPASE, BLOOD  TROPONIN I    Imaging Review Dg Chest 2 View  04/05/2014   CLINICAL DATA:  Chest pain, shortness of breath  EXAM: CHEST  2 VIEW  COMPARISON:  03/06/2014  FINDINGS: Chronic interstitial markings. No focal consolidation. No pleural effusion or pneumothorax.  The heart is normal in size.  Mild degenerative changes of the visualized thoracolumbar spine.  IMPRESSION: No evidence of acute cardiopulmonary disease.   Electronically Signed   By: Julian Hy M.D.   On: 04/05/2014 20:58   Ct Abdomen Pelvis W Contrast  04/05/2014   CLINICAL DATA:  Generalized abdominal pain.  EXAM: CT ABDOMEN AND PELVIS WITH CONTRAST  TECHNIQUE: Multidetector CT imaging of the abdomen and pelvis was performed using the standard protocol following bolus administration of intravenous contrast.  CONTRAST:  43mL OMNIPAQUE IOHEXOL 300 MG/ML SOLN, 166mL OMNIPAQUE IOHEXOL 300 MG/ML SOLN  COMPARISON:  06/23/2013  FINDINGS: Normal heart size. Trace pericardial  fluid. Mild dependent atelectasis.  Sub cm hypodensity within the left hepatic lobe is unchanged and favored to reflect a biliary cyst. Partially distended gallbladder. No radiodense gallstones or biliary ductal dilatation. No appreciable abnormality of the spleen, pancreas, adrenal glands.  Lobular renal contours with areas of scarring/ cortical thinning involving the right kidney posteriorly. Bifid right renal pelvis. No hydroureteronephrosis.  No overt colitis. Normal appendix. Small bowel loops are of normal course and caliber. Tiny hiatal hernia. No free intraperitoneal air or fluid. No lymphadenopathy. Scattered atherosclerotic disease of the aorta and branch vessels without aneurysmal dilatation.  Small posterior left bladder diverticulum. Question TURP defect. Prostate gland measures 5.4 cm transverse diameter. Small fat containing inguinal hernia and/or spermatic cord lipoma.  Multilevel degenerative change no acute osseous finding.  IMPRESSION: No acute abdominopelvic process identified by CT.  Prostatomegaly. Posterior left bladder diverticulum may reflect sequelae of chronic partial outlet obstruction.   Electronically Signed  By: Carlos Levering M.D.   On: 04/05/2014 22:17     EKG Interpretation   Date/Time:  Saturday April 05 2014 20:01:24 EDT Ventricular Rate:  98 PR Interval:  174 QRS Duration: 92 QT Interval:  356 QTC Calculation: 454 R Axis:   -48 Text Interpretation:  Normal sinus rhythm Left anterior fascicular block  Moderate voltage criteria for LVH, may be normal variant Cannot rule out  Septal infarct , age undetermined Abnormal ECG No significant change was  found Confirmed by Wyvonnia Dusky  MD, Pakou Rainbow [phone]) on 04/05/2014 8:04:39 PM      MDM   Final diagnoses:  Chest pain, unspecified chest pain type  presents with chest pain that woke from sleep around 4 AM associated with shortness of breath, nausea and diaphoresis. Pain last 10-15 minutes at a time. Associated with  diffuse abdominal pain and nausea as well. [place].  EKG unchanged. Troponin negative. LFTs normal. Lipase normal.  Patient given aspirin, GI cocktail.   CT negative for acute pathology of abdominal pain. Chest x-ray negative.  Concerning for angina given patient's description of his chest pain. He is pain-free right now. Troponin is negative. Chest x-rays negative. No further chest pain in the ED. No shortness of breath.  Admission recommended for chest pain rule out given patient's description and concern for angina. He is refusing to be admitted. He is alert and oriented 3 and appears to have capacity to make medical decisions. He will leave [place] and realizes he could be at risk for heart attack which could death or disability.    I personally performed the services described in this documentation, which was scribed in my presence. The recorded information has been reviewed and is accurate.   Ezequiel Essex, MD 04/05/14 2308

## 2014-04-05 NOTE — Discharge Instructions (Signed)
Chest Pain (Nonspecific) You are leaving AGAINST MEDICAL ADVICE. You could be at risk for having a heart attack which may cause death. Return to the ED if you develop new or worsening symptoms. It is often hard to give a diagnosis for the cause of chest pain. There is always a chance that your pain could be related to something serious, such as a heart attack or a blood clot in the lungs. You need to follow up with your doctor. HOME CARE  If antibiotic medicine was given, take it as directed by your doctor. Finish the medicine even if you start to feel better.  For the next few days, avoid activities that bring on chest pain. Continue physical activities as told by your doctor.  Do not use any tobacco products. This includes cigarettes, chewing tobacco, and e-cigarettes.  Avoid drinking alcohol.  Only take medicine as told by your doctor.  Follow your doctor's suggestions for more testing if your chest pain does not go away.  Keep all doctor visits you made. GET HELP IF:  Your chest pain does not go away, even after treatment.  You have a rash with blisters on your chest.  You have a fever. GET HELP RIGHT AWAY IF:   You have more pain or pain that spreads to your arm, neck, jaw, back, or belly (abdomen).  You have shortness of breath.  You cough more than usual or cough up blood.  You have very bad back or belly pain.  You feel sick to your stomach (nauseous) or throw up (vomit).  You have very bad weakness.  You pass out (faint).  You have chills. This is an emergency. Do not wait to see if the problems will go away. Call your local emergency services (911 in [place] Do not drive yourself to the hospital. MAKE SURE YOU:   Understand these instructions.  Will watch your condition.  Will get help right away if you are not doing well or get worse. Document Released: 06/22/2007 Document Revised: 01/08/2013 Document Reviewed: 06/22/2007 [facility] Patient Information  2015 [place], [place]. This information is not intended to replace advice given to you by your health care provider. Make sure you discuss any questions you have with your health care provider.

## 2014-07-27 ENCOUNTER — Emergency Department (HOSPITAL_BASED_OUTPATIENT_CLINIC_OR_DEPARTMENT_OTHER)
Admission: EM | Admit: 2014-07-27 | Discharge: 2014-07-27 | Disposition: A | Discharge status: Home / Self Care | Payer: Medicare Other | Attending: Emergency Medicine | Admitting: Emergency Medicine

## 2014-07-27 ENCOUNTER — Encounter (HOSPITAL_BASED_OUTPATIENT_CLINIC_OR_DEPARTMENT_OTHER): Payer: Self-pay | Admitting: Emergency Medicine

## 2014-07-27 DIAGNOSIS — Z87891 Personal history of nicotine dependence: Secondary | ICD-10-CM | POA: Insufficient documentation

## 2014-07-27 DIAGNOSIS — Z87448 Personal history of other diseases of urinary system: Secondary | ICD-10-CM | POA: Diagnosis not present

## 2014-07-27 DIAGNOSIS — Y9289 Other specified places as the place of occurrence of the external cause: Secondary | ICD-10-CM | POA: Insufficient documentation

## 2014-07-27 DIAGNOSIS — Z859 Personal history of malignant neoplasm, unspecified: Secondary | ICD-10-CM | POA: Diagnosis not present

## 2014-07-27 DIAGNOSIS — R110 Nausea: Secondary | ICD-10-CM | POA: Diagnosis not present

## 2014-07-27 DIAGNOSIS — Z87442 Personal history of urinary calculi: Secondary | ICD-10-CM | POA: Insufficient documentation

## 2014-07-27 DIAGNOSIS — Y998 Other external cause status: Secondary | ICD-10-CM | POA: Diagnosis not present

## 2014-07-27 DIAGNOSIS — Z85828 Personal history of other malignant neoplasm of skin: Secondary | ICD-10-CM | POA: Diagnosis not present

## 2014-07-27 DIAGNOSIS — W01198A Fall on same level from slipping, tripping and stumbling with subsequent striking against other object, initial encounter: Secondary | ICD-10-CM | POA: Diagnosis not present

## 2014-07-27 DIAGNOSIS — E785 Hyperlipidemia, unspecified: Secondary | ICD-10-CM | POA: Diagnosis not present

## 2014-07-27 DIAGNOSIS — S0083XA Contusion of other part of head, initial encounter: Secondary | ICD-10-CM | POA: Insufficient documentation

## 2014-07-27 DIAGNOSIS — M1990 Unspecified osteoarthritis, unspecified site: Secondary | ICD-10-CM | POA: Insufficient documentation

## 2014-07-27 DIAGNOSIS — Z87440 Personal history of urinary (tract) infections: Secondary | ICD-10-CM | POA: Insufficient documentation

## 2014-07-27 DIAGNOSIS — Z79899 Other long term (current) drug therapy: Secondary | ICD-10-CM | POA: Insufficient documentation

## 2014-07-27 DIAGNOSIS — R55 Syncope and collapse: Secondary | ICD-10-CM | POA: Diagnosis not present

## 2014-07-27 DIAGNOSIS — Z792 Long term (current) use of antibiotics: Secondary | ICD-10-CM | POA: Insufficient documentation

## 2014-07-27 DIAGNOSIS — R531 Weakness: Secondary | ICD-10-CM | POA: Diagnosis not present

## 2014-07-27 DIAGNOSIS — I10 Essential (primary) hypertension: Secondary | ICD-10-CM | POA: Diagnosis not present

## 2014-07-27 DIAGNOSIS — Y9389 Activity, other specified: Secondary | ICD-10-CM | POA: Diagnosis not present

## 2014-07-27 DIAGNOSIS — Z8719 Personal history of other diseases of the digestive system: Secondary | ICD-10-CM | POA: Insufficient documentation

## 2014-07-27 DIAGNOSIS — R630 Anorexia: Secondary | ICD-10-CM | POA: Insufficient documentation

## 2014-07-27 LAB — URINALYSIS, ROUTINE W REFLEX MICROSCOPIC
Bilirubin Urine: NEGATIVE
Glucose, UA: NEGATIVE mg/dL
Hgb urine dipstick: NEGATIVE
Ketones, ur: NEGATIVE mg/dL
LEUKOCYTES UA: NEGATIVE
NITRITE: NEGATIVE
PH: 6.5 (ref 5.0–8.0)
Protein, ur: NEGATIVE mg/dL
Specific Gravity, Urine: 1.015 (ref 1.005–1.030)
Urobilinogen, UA: 1 mg/dL (ref 0.0–1.0)

## 2014-07-27 LAB — CBC WITH DIFFERENTIAL/PLATELET
Basophils Absolute: 0 10*3/uL (ref 0.0–0.1)
Basophils Relative: 0 % (ref 0–1)
EOS ABS: 0.2 10*3/uL (ref 0.0–0.7)
Eosinophils Relative: 3 % (ref 0–5)
HEMATOCRIT: 38.6 % — AB (ref 39.0–52.0)
Hemoglobin: 13 g/dL (ref 13.0–17.0)
Lymphocytes Relative: 31 % (ref 12–46)
Lymphs Abs: 1.9 10*3/uL (ref 0.7–4.0)
MCH: 31.7 pg (ref 26.0–34.0)
MCHC: 33.7 g/dL (ref 30.0–36.0)
MCV: 94.1 fL (ref 78.0–100.0)
MONO ABS: 0.8 10*3/uL (ref 0.1–1.0)
Monocytes Relative: 12 % (ref 3–12)
Neutro Abs: 3.3 10*3/uL (ref 1.7–7.7)
Neutrophils Relative %: 54 % (ref 43–77)
PLATELETS: 246 10*3/uL (ref 150–400)
RBC: 4.1 MIL/uL — ABNORMAL LOW (ref 4.22–5.81)
RDW: 12.5 % (ref 11.5–15.5)
WBC: 6.1 10*3/uL (ref 4.0–10.5)

## 2014-07-27 LAB — BASIC METABOLIC PANEL
ANION GAP: 7 (ref 5–15)
BUN: 16 mg/dL (ref 6–20)
CHLORIDE: 103 mmol/L (ref 101–111)
CO2: 26 mmol/L (ref 22–32)
Calcium: 9.1 mg/dL (ref 8.9–10.3)
Creatinine, Ser: 1.15 mg/dL (ref 0.61–1.24)
GFR calc Af Amer: >60 mL/min (ref >60)
Glucose, Bld: 114 mg/dL — ABNORMAL HIGH (ref 65–99)
POTASSIUM: 3.9 mmol/L (ref 3.5–5.1)
SODIUM: 136 mmol/L (ref 135–145)

## 2014-07-27 MED ORDER — SODIUM CHLORIDE 0.9 % IV BOLUS (SEPSIS)
1000.0000 mL | Freq: Once | INTRAVENOUS | Status: AC
  Administered 2014-07-27: 1000 mL via INTRAVENOUS

## 2014-07-27 MED ORDER — ONDANSETRON HCL 4 MG/2ML IJ SOLN
4.0000 mg | Freq: Once | INTRAMUSCULAR | Status: AC
  Administered 2014-07-27: 4 mg via INTRAVENOUS
  Filled 2014-07-27: qty 2

## 2014-07-27 NOTE — Discharge Instructions (Signed)
Near-Syncope Near-syncope (commonly known as near fainting) is sudden weakness, dizziness, or feeling like you might pass out. During an episode of near-syncope, you may also develop pale skin, have tunnel vision, or feel sick to your stomach (nauseous). Near-syncope may occur when getting up after sitting or while standing for a long time. It is caused by a sudden decrease in blood flow to the brain. This decrease can result from various causes or triggers, most of which are not serious. However, because near-syncope can sometimes be a sign of something serious, a medical evaluation is required. The specific cause is often not determined. HOME CARE INSTRUCTIONS  Monitor your condition for any changes. The following actions may help to alleviate any discomfort you are experiencing:  Have someone stay with you until you feel stable.  Lie down right away and prop your feet up if you start feeling like you might faint. Breathe deeply and steadily. Wait until all the symptoms have passed. Most of these episodes last only a few minutes. You may feel tired for several hours.   Drink enough fluids to keep your urine clear or pale yellow.   If you are taking blood pressure or heart medicine, get up slowly when seated or lying down. Take several minutes to sit and then stand. This can reduce dizziness.  Follow up with your health care provider as directed. SEEK IMMEDIATE MEDICAL CARE IF:   You have a severe headache.   You have unusual pain in the chest, abdomen, or back.   You are bleeding from the mouth or rectum, or you have black or tarry stool.   You have an irregular or very fast heartbeat.   You have repeated fainting or have seizure-like jerking during an episode.   You faint when sitting or lying down.   You have confusion.   You have difficulty walking.   You have severe weakness.   You have vision problems.  MAKE SURE YOU:   Understand these instructions.  Will  watch your condition.  Will get help right away if you are not doing well or get worse. Document Released: 01/03/2005 Document Revised: 01/08/2013 Document Reviewed: 06/08/2012 [employer] Patient Information 2015 [employer]. This information is not intended to replace advice given to you by your health care provider. Make sure you discuss any questions you have with your health care provider.  

## 2014-07-27 NOTE — ED Provider Notes (Signed)
CSN: [id number]     Arrival date & time 07/27/14  0545 History   First MD Initiated Contact with Patient 07/27/14 0606     Chief Complaint  Patient presents with  . Near Syncope     (Consider location/radiation/quality/duration/timing/severity/associated sxs/prior Treatment) The history is provided by the patient.   Patient is 5 days out from recent laparoscopic inguinal hernia repair.  He presents the emergency department today because he got up in the middle night to use the restroom and fell forward striking his head.  He states his had mild generalized weakness over the past 24 hours.  He's had anorexia.  He reports mild nausea now.  Denies increasing abdominal pain.  No fevers or chills.  Patient was started on antibiotics and he is currently still taking Bactrim for reported urinary tract infection while he was hospitalized.  He denies dysuria or urinary frequency.  Denies chest pain shortness of breath.  No upper abdominal pain.  He is not on anticoagulants.  He denies significant headache at this time.  No changes vision.  Denies neck pain.  No weakness of his arms or legs.  Denies malocclusion.   Past Medical History  Diagnosis Date  . Hypertension   . Hyperlipidemia   . Cancer   . Skin cancer   . Arthritis   . Osteoarthritis   . Renal disorder   . Kidney stones   . Diverticulosis    Past Surgical History  Procedure Laterality Date  . Skin biopsy    . Hernia repair    . Prostate ablation     History reviewed. No pertinent family history. History  Substance Use Topics  . Smoking status: Former Research scientist (life sciences)  . Smokeless tobacco: Not on file  . Alcohol Use: No    Review of Systems  All other systems reviewed and are negative.     Allergies  Pneumococcal vaccines; Sulfa antibiotics; and Aspirin  Home Medications   Prior to Admission medications   Medication Sig Start Date End Date Taking? Authorizing Provider  albuterol (PROVENTIL HFA;VENTOLIN HFA) 108 (90 BASE)  MCG/ACT inhaler Inhale 2 puffs into the lungs every 4 (four) hours as needed for wheezing or shortness of breath.   Yes Historical Provider, MD  amLODipine (NORVASC) 5 MG tablet Take 5 mg by mouth daily.   Yes Historical Provider, MD  atorvastatin (LIPITOR) 40 MG tablet Take 40 mg by mouth daily.   Yes Historical Provider, MD  baclofen (LIORESAL) 10 MG tablet Take 10 mg by mouth 2 (two) times daily.   Yes Historical Provider, MD  buPROPion (WELLBUTRIN) 100 MG tablet Take 30 mg by mouth once.   Yes Historical Provider, MD  carvedilol (COREG) 3.125 MG tablet Take 3.125 mg by mouth 2 (two) times daily with a meal.   Yes Historical Provider, MD  DULoxetine (CYMBALTA) 60 MG capsule Take 60 mg by mouth daily.   Yes Historical Provider, MD  lisinopril (PRINIVIL,ZESTRIL) 5 MG tablet Take 5 mg by mouth daily.   Yes Historical Provider, MD  lubiprostone (AMITIZA) 8 MCG capsule Take 5 mcg by mouth 2 (two) times daily with a meal.   Yes Historical Provider, MD  nitroGLYCERIN (NITROSTAT) 0.4 MG SL tablet Place 0.4 mg under the tongue every 5 (five) minutes as needed for chest pain.   Yes Historical Provider, MD  omeprazole (PRILOSEC) 40 MG capsule Take 40 mg by mouth daily.   Yes Historical Provider, MD  sulfamethoxazole-trimethoprim (BACTRIM DS,SEPTRA DS) 800-160 MG per tablet Take 1  tablet by mouth 2 (two) times daily.   Yes Historical Provider, MD  tamsulosin (FLOMAX) 0.4 MG CAPS capsule Take 0.4 mg by mouth daily.   Yes Historical Provider, MD  traZODone (DESYREL) 100 MG tablet Take 100 mg by mouth at bedtime.   Yes Historical Provider, MD  HYDROcodone-acetaminophen (NORCO/VICODIN) 5-325 MG per tablet Take 1-2 tablets by mouth every 6 (six) hours as needed (for pain; may cause constipation). 12/21/13   John Molpus, MD  ranitidine (ZANTAC) 75 MG tablet Take 1 tablet (75 mg total) by mouth 2 (two) times daily. 06/24/13   Serita Grit, MD   BP 127/93 mmHg  Pulse 102  Temp(Src) 97.5 F (36.4 C) (Oral)  Resp 20   Ht 6\' 2"  (1.88 m)  Wt 182 lb (82.555 kg)  BMI 23.36 kg/m2  SpO2 97% Physical Exam  Constitutional: He is oriented to person, place, and time. He appears well-developed and well-nourished.  HENT:  Head: Normocephalic and atraumatic.  Very small hematoma to his midline forehead.  No laceration.  Extra ocular movements are intact  Eyes: EOM are normal.  Neck: Normal range of motion.  Cardiovascular: Normal rate, regular rhythm, normal heart sounds and intact distal pulses.   Pulmonary/Chest: Effort normal and breath sounds normal. No respiratory distress.  Abdominal: Soft. He exhibits no distension. There is no tenderness.  Musculoskeletal: Normal range of motion.  Neurological: He is alert and oriented to person, place, and time.  Skin: Skin is warm and dry.  Psychiatric: He has a normal mood and affect. Judgment normal.  Nursing note and vitals reviewed.   ED Course  Procedures (including critical care time) Labs Review Labs Reviewed  CBC WITH DIFFERENTIAL/PLATELET - Abnormal; Notable for the following:    RBC 4.10 (*)    HCT 38.6 (*)    All other components within normal limits  BASIC METABOLIC PANEL  URINALYSIS, ROUTINE W REFLEX MICROSCOPIC (NOT AT [facility])    Imaging Review No results found.   EKG Interpretation   Date/Time:  Sunday July 27 2014 06:02:52 EDT Ventricular Rate:  83 PR Interval:  178 QRS Duration: 96 QT Interval:  370 QTC Calculation: 434 R Axis:   -38 Text Interpretation:  Sinus rhythm with Premature supraventricular  complexes Left axis deviation Minimal voltage criteria for LVH, may be  normal variant Septal infarct , age undetermined Abnormal ECG No  significant change was found Confirmed by Jelissa Espiritu  MD, Shakemia Madera ([id number]) on  07/27/2014 6:06:50 AM      MDM   Final diagnoses:  None    Strong consideration for mild volume depletion as the cause of his fall today.  Normal neurologic exam.  Abdominal exam is without significant tenderness.  His  laparoscopic surgical incisions appear to be healing well.  Plan will be for her labs, urine, IV fluids and reassessment.  Vital signs are normal.  7:09 AM Care to Dr Marnee Guarneri, MD 07/28/14 [id number]

## 2014-07-27 NOTE — ED Notes (Signed)
Urine specimen requested and urinal provided

## 2014-07-27 NOTE — ED Notes (Signed)
Pt states still unable to void for urine spec

## 2014-07-27 NOTE — ED Notes (Signed)
Pt reports that he " fell out " while walking to the BR at about 0400 this AM. Denies complete lost of consciousness and was aware of his surrounding but was globally weak and could not stop his fall. Pt admits to striking head against the night stand and abrasion noted to bridge of nose and above right eye on forehead. Pt continues to deny LOC

## 2014-07-27 NOTE — ED Provider Notes (Signed)
  Physical Exam  BP 132/74 mmHg  Pulse 78  Temp(Src) 97.5 F (36.4 C) (Oral)  Resp 18  Ht 6\' 2"  (1.88 m)  Wt 182 lb (82.555 kg)  BMI 23.36 kg/m2  SpO2 97%  Physical Exam  ED Course  Procedures  MDM Patient with near syncope. Lab work reassuring. No severe injury from the fall. Urine does not show infection. Patient feels better after fluids and be discharged home.      Davonna Belling, MD 07/27/14 [phone]

## 2014-09-29 ENCOUNTER — Encounter (HOSPITAL_BASED_OUTPATIENT_CLINIC_OR_DEPARTMENT_OTHER): Payer: Self-pay | Admitting: *Deleted

## 2014-09-29 ENCOUNTER — Emergency Department (HOSPITAL_BASED_OUTPATIENT_CLINIC_OR_DEPARTMENT_OTHER)
Admission: EM | Admit: 2014-09-29 | Discharge: 2014-09-30 | Disposition: A | Discharge status: Home / Self Care | Payer: Medicare Other | Attending: Emergency Medicine | Admitting: Emergency Medicine

## 2014-09-29 DIAGNOSIS — R112 Nausea with vomiting, unspecified: Secondary | ICD-10-CM | POA: Diagnosis present

## 2014-09-29 DIAGNOSIS — Z792 Long term (current) use of antibiotics: Secondary | ICD-10-CM | POA: Insufficient documentation

## 2014-09-29 DIAGNOSIS — Z85828 Personal history of other malignant neoplasm of skin: Secondary | ICD-10-CM | POA: Diagnosis not present

## 2014-09-29 DIAGNOSIS — Z87448 Personal history of other diseases of urinary system: Secondary | ICD-10-CM | POA: Insufficient documentation

## 2014-09-29 DIAGNOSIS — E785 Hyperlipidemia, unspecified: Secondary | ICD-10-CM | POA: Insufficient documentation

## 2014-09-29 DIAGNOSIS — Z79899 Other long term (current) drug therapy: Secondary | ICD-10-CM | POA: Diagnosis not present

## 2014-09-29 DIAGNOSIS — N3000 Acute cystitis without hematuria: Secondary | ICD-10-CM | POA: Diagnosis not present

## 2014-09-29 DIAGNOSIS — Z8719 Personal history of other diseases of the digestive system: Secondary | ICD-10-CM | POA: Diagnosis not present

## 2014-09-29 DIAGNOSIS — Z87891 Personal history of nicotine dependence: Secondary | ICD-10-CM | POA: Diagnosis not present

## 2014-09-29 DIAGNOSIS — Z87442 Personal history of urinary calculi: Secondary | ICD-10-CM | POA: Diagnosis not present

## 2014-09-29 DIAGNOSIS — I10 Essential (primary) hypertension: Secondary | ICD-10-CM | POA: Insufficient documentation

## 2014-09-29 DIAGNOSIS — M1990 Unspecified osteoarthritis, unspecified site: Secondary | ICD-10-CM | POA: Diagnosis not present

## 2014-09-29 LAB — CBC WITH DIFFERENTIAL/PLATELET
BASOS PCT: 0 % (ref 0–1)
Basophils Absolute: 0 10*3/uL (ref 0.0–0.1)
EOS ABS: 0.1 10*3/uL (ref 0.0–0.7)
EOS PCT: 2 % (ref 0–5)
HCT: 38 % — ABNORMAL LOW (ref 39.0–52.0)
HEMOGLOBIN: 12.7 g/dL — AB (ref 13.0–17.0)
Lymphocytes Relative: 35 % (ref 12–46)
Lymphs Abs: 2 10*3/uL (ref 0.7–4.0)
MCH: 31.8 pg (ref 26.0–34.0)
MCHC: 33.4 g/dL (ref 30.0–36.0)
MCV: 95 fL (ref 78.0–100.0)
MONOS PCT: 11 % (ref 3–12)
Monocytes Absolute: 0.6 10*3/uL (ref 0.1–1.0)
NEUTROS PCT: 52 % (ref 43–77)
Neutro Abs: 2.9 10*3/uL (ref 1.7–7.7)
Platelets: 245 10*3/uL (ref 150–400)
RBC: 4 MIL/uL — ABNORMAL LOW (ref 4.22–5.81)
RDW: 12.7 % (ref 11.5–15.5)
WBC: 5.7 10*3/uL (ref 4.0–10.5)

## 2014-09-29 LAB — BASIC METABOLIC PANEL
Anion gap: 8 (ref 5–15)
BUN: 16 mg/dL (ref 6–20)
CALCIUM: 9.1 mg/dL (ref 8.9–10.3)
CO2: 28 mmol/L (ref 22–32)
CREATININE: 1.14 mg/dL (ref 0.61–1.24)
Chloride: 106 mmol/L (ref 101–111)
GFR calc Af Amer: >60 mL/min (ref >60)
GFR calc non Af Amer: >60 mL/min (ref >60)
Glucose, Bld: 115 mg/dL — ABNORMAL HIGH (ref 65–99)
Potassium: 3.5 mmol/L (ref 3.5–5.1)
Sodium: 142 mmol/L (ref 135–145)

## 2014-09-29 LAB — URINALYSIS, ROUTINE W REFLEX MICROSCOPIC
Glucose, UA: NEGATIVE mg/dL
Hgb urine dipstick: NEGATIVE
KETONES UR: NEGATIVE mg/dL
Nitrite: POSITIVE — AB
PH: 6 (ref 5.0–8.0)
Protein, ur: NEGATIVE mg/dL
Specific Gravity, Urine: 1.026 (ref 1.005–1.030)
UROBILINOGEN UA: 1 mg/dL (ref 0.0–1.0)

## 2014-09-29 LAB — LIPASE, BLOOD: Lipase: 32 U/L (ref 22–51)

## 2014-09-29 LAB — URINE MICROSCOPIC-ADD ON

## 2014-09-29 LAB — AMYLASE: AMYLASE: 40 U/L (ref 28–100)

## 2014-09-29 NOTE — ED Notes (Signed)
Abdominal pain all day. Vomiting tonight.

## 2014-09-30 DIAGNOSIS — N3000 Acute cystitis without hematuria: Secondary | ICD-10-CM | POA: Diagnosis not present

## 2014-09-30 MED ORDER — CEPHALEXIN 500 MG PO CAPS: 500.0000 mg | ORAL_CAPSULE | Freq: Two times a day (BID) | ORAL | Status: DC

## 2014-09-30 MED ORDER — ONDANSETRON 8 MG PO TBDP: 8.0000 mg | ORAL_TABLET | Freq: Three times a day (TID) | ORAL | Status: DC | PRN

## 2014-09-30 MED ORDER — CEFTRIAXONE SODIUM 1 G IJ SOLR
INTRAMUSCULAR | Status: AC
  Filled 2014-09-30: qty 10

## 2014-09-30 MED ORDER — ONDANSETRON HCL 4 MG/2ML IJ SOLN
4.0000 mg | Freq: Once | INTRAMUSCULAR | Status: AC
  Administered 2014-09-30: 4 mg via INTRAVENOUS
  Filled 2014-09-30: qty 2

## 2014-09-30 MED ORDER — SODIUM CHLORIDE 0.9 % IV BOLUS (SEPSIS)
1000.0000 mL | Freq: Once | INTRAVENOUS | Status: AC
  Administered 2014-09-30: 1000 mL via INTRAVENOUS

## 2014-09-30 MED ORDER — DEXTROSE 5 % IV SOLN
1.0000 g | Freq: Once | INTRAVENOUS | Status: AC
  Administered 2014-09-30: 1 g via INTRAVENOUS

## 2014-09-30 NOTE — ED Provider Notes (Addendum)
CSN: [id number]     Arrival date & time 09/29/14  2138 History  This chart was scribed for Casey Rosser, MD by Stephania Fragmin, ED Scribe. This patient was seen in room MH01/MH01 and the patient's care was started at 12:15 AM.    Chief Complaint  Patient presents with  . Vomiting    The history is provided by the patient. No language interpreter was used.    HPI Comments: Casey Moreno is a 74 y.o. male who presents to the Emergency Department complaining of nausea which began yesterday morning. He began vomiting yesterday evening after dinner. He states that he vomits about 5 minutes after anything he eats. Patient also complains of LLQ abdominal pain that has been persistent since a left inguinal herniorrhaphy several months ago. Earlier it was worse than baseline, but this has since returned to its baseline mild level. He also notes associated chills and dysuria. He denies fever, diarrhea or hematuria.   Past Medical History  Diagnosis Date  . Hypertension   . Hyperlipidemia   . Cancer   . Skin cancer   . Arthritis   . Osteoarthritis   . Renal disorder   . Kidney stones   . Diverticulosis    Past Surgical History  Procedure Laterality Date  . Skin biopsy    . Hernia repair    . Prostate ablation     No family history on file. Social History  Substance Use Topics  . Smoking status: Former Research scientist (life sciences)  . Smokeless tobacco: None  . Alcohol Use: No    Review of Systems  All other systems reviewed and are negative.  Allergies  Pneumococcal vaccines; Sulfa antibiotics; and Aspirin  Home Medications   Prior to Admission medications   Medication Sig Start Date End Date Taking? Authorizing Provider  albuterol (PROVENTIL HFA;VENTOLIN HFA) 108 (90 BASE) MCG/ACT inhaler Inhale 2 puffs into the lungs every 4 (four) hours as needed for wheezing or shortness of breath.    Historical Provider, MD  amLODipine (NORVASC) 5 MG tablet Take 5 mg by mouth daily.    Historical Provider, MD   atorvastatin (LIPITOR) 40 MG tablet Take 40 mg by mouth daily.    Historical Provider, MD  baclofen (LIORESAL) 10 MG tablet Take 10 mg by mouth 2 (two) times daily.    Historical Provider, MD  buPROPion (WELLBUTRIN) 100 MG tablet Take 30 mg by mouth once.    Historical Provider, MD  carvedilol (COREG) 3.125 MG tablet Take 3.125 mg by mouth 2 (two) times daily with a meal.    Historical Provider, MD  DULoxetine (CYMBALTA) 60 MG capsule Take 60 mg by mouth daily.    Historical Provider, MD  HYDROcodone-acetaminophen (NORCO/VICODIN) 5-325 MG per tablet Take 1-2 tablets by mouth every 6 (six) hours as needed (for pain; may cause constipation). 12/21/13   Randee Upchurch, MD  lisinopril (PRINIVIL,ZESTRIL) 5 MG tablet Take 5 mg by mouth daily.    Historical Provider, MD  lubiprostone (AMITIZA) 8 MCG capsule Take 5 mcg by mouth 2 (two) times daily with a meal.    Historical Provider, MD  nitroGLYCERIN (NITROSTAT) 0.4 MG SL tablet Place 0.4 mg under the tongue every 5 (five) minutes as needed for chest pain.    Historical Provider, MD  omeprazole (PRILOSEC) 40 MG capsule Take 40 mg by mouth daily.    Historical Provider, MD  ranitidine (ZANTAC) 75 MG tablet Take 1 tablet (75 mg total) by mouth 2 (two) times daily. 06/24/13  Serita Grit, MD  sulfamethoxazole-trimethoprim (BACTRIM DS,SEPTRA DS) 800-160 MG per tablet Take 1 tablet by mouth 2 (two) times daily.    Historical Provider, MD  tamsulosin (FLOMAX) 0.4 MG CAPS capsule Take 0.4 mg by mouth daily.    Historical Provider, MD  traZODone (DESYREL) 100 MG tablet Take 100 mg by mouth at bedtime.    Historical Provider, MD   BP 141/95 mmHg  Pulse 90  Temp(Src) 97.5 F (36.4 C) (Oral)  Resp 20  Ht 6\' 1"  (1.854 m)  Wt 176 lb (79.833 kg)  BMI 23.23 kg/m2  SpO2 100%   Physical Exam  General: Well-developed, well-nourished male in no acute distress; appearance consistent with age of record HENT: normocephalic; atraumatic Eyes: pupils equal, round and  reactive to light; extraocular muscles intact; lens implants Neck: supple Heart: regular rate and rhythm; no murmurs, rubs or gallops Lungs: clear to auscultation bilaterally Abdomen: soft; nondistended; no masses or hepatosplenomegaly; bowel sounds present; LLQ pain earlier worse than baseline, but pain has now returned to postoperative pain S/P herniorrhaphy several months ago per patient GU: Urine dark and cloudy Extremities: No deformity; full range of motion; pulses normal Neurologic: Awake, alert and oriented; motor function intact in all extremities and symmetric; no facial droop Skin: Warm and dry Psychiatric: Normal mood and affect   ED Course  Procedures (including critical care time)  DIAGNOSTIC STUDIES: Oxygen Saturation is 100% on RA, normal by my interpretation.    COORDINATION OF CARE: 12:21 AM - Discussed treatment plan with pt at bedside which includes IV fluids and nausea medication. Pt verbalized understanding and agreed to plan.     MDM   Nursing notes and vitals signs, including pulse oximetry, reviewed.  Summary of this visit's results, reviewed by myself:  Labs:  Results for orders placed or performed during the hospital encounter of 09/29/14 (from the past 24 hour(s))  Basic metabolic panel     Status: Abnormal   Collection Time: 09/29/14 10:30 AM  Result Value Ref Range   Sodium 142 135 - 145 mmol/L   Potassium 3.5 3.5 - 5.1 mmol/L   Chloride 106 101 - 111 mmol/L   CO2 28 22 - 32 mmol/L   Glucose, Bld 115 (H) 65 - 99 mg/dL   BUN 16 6 - 20 mg/dL   Creatinine, Ser 1.14 0.61 - 1.24 mg/dL   Calcium 9.1 8.9 - 10.3 mg/dL   GFR calc non Af Amer >60 >60 mL/min   GFR calc Af Amer >60 >60 mL/min   Anion gap 8 5 - 15  CBC WITH DIFFERENTIAL     Status: Abnormal   Collection Time: 09/29/14 10:30 AM  Result Value Ref Range   WBC 5.7 4.0 - 10.5 K/uL   RBC 4.00 (L) 4.22 - 5.81 MIL/uL   Hemoglobin 12.7 (L) 13.0 - 17.0 g/dL   HCT 38.0 (L) 39.0 - 52.0 %   MCV  95.0 78.0 - 100.0 fL   MCH 31.8 26.0 - 34.0 pg   MCHC 33.4 30.0 - 36.0 g/dL   RDW 12.7 11.5 - 15.5 %   Platelets 245 150 - 400 K/uL   Neutrophils Relative % 52 43 - 77 %   Neutro Abs 2.9 1.7 - 7.7 K/uL   Lymphocytes Relative 35 12 - 46 %   Lymphs Abs 2.0 0.7 - 4.0 K/uL   Monocytes Relative 11 3 - 12 %   Monocytes Absolute 0.6 0.1 - 1.0 K/uL   Eosinophils Relative 2 0 - 5 %  Eosinophils Absolute 0.1 0.0 - 0.7 K/uL   Basophils Relative 0 0 - 1 %   Basophils Absolute 0.0 0.0 - 0.1 K/uL  Amylase     Status: None   Collection Time: 09/29/14 10:30 AM  Result Value Ref Range   Amylase 40 28 - 100 U/L  Lipase, blood     Status: None   Collection Time: 09/29/14 10:30 AM  Result Value Ref Range   Lipase 32 22 - 51 U/L  Urinalysis, Routine w reflex microscopic (not at [facility])     Status: Abnormal   Collection Time: 09/29/14  9:55 PM  Result Value Ref Range   Color, Urine AMBER (A) YELLOW   APPearance CLOUDY (A) CLEAR   Specific Gravity, Urine 1.026 1.005 - 1.030   pH 6.0 5.0 - 8.0   Glucose, UA NEGATIVE NEGATIVE mg/dL   Hgb urine dipstick NEGATIVE NEGATIVE   Bilirubin Urine SMALL (A) NEGATIVE   Ketones, ur NEGATIVE NEGATIVE mg/dL   Protein, ur NEGATIVE NEGATIVE mg/dL   Urobilinogen, UA 1.0 0.0 - 1.0 mg/dL   Nitrite POSITIVE (A) NEGATIVE   Leukocytes, UA SMALL (A) NEGATIVE  Urine microscopic-add on     Status: Abnormal   Collection Time: 09/29/14  9:55 PM  Result Value Ref Range   Squamous Epithelial / LPF FEW (A) RARE   WBC, UA 7-10 <3 WBC/hpf   Bacteria, UA FEW (A) RARE   Urine-Other AMORPHOUS URATES/PHOSPHATES    1:09 AM Nausea improved after IV Zofran. Patient given Rocephin 1 gram IV for urinary tract infection. Eating and drinking without emesis.  I personally performed the services described in this documentation, which was scribed in my presence. The recorded information has been reviewed and is accurate.   Casey Rosser, MD 09/30/14 1173  Casey Rosser, MD 09/30/14  [id number]

## 2014-09-30 NOTE — ED Notes (Addendum)
Pt ambulating with EMT at this time, EDPA at [facility]. Pt alert, NAD, calm, steady gait. Up to b/r. Dr. Florina Ou at [facility] updating pt/family about results and plan.

## 2014-10-02 LAB — URINE CULTURE

## 2014-10-03 NOTE — Progress Notes (Signed)
ED Antimicrobial Stewardship Positive Culture Follow Up   Casey Moreno is an 74 y.o. male who presented to [facility] on 09/29/2014 with a chief complaint of  Chief Complaint  Patient presents with  . Vomiting     Recent Results (from the past 720 hour(s))  Urine culture     Status: None   Collection Time: 09/29/14  9:55 PM  Result Value Ref Range Status   Specimen Description URINE, CLEAN CATCH  Final   Special Requests NONE  Final   Culture   Final    >=100,000 COLONIES/mL STAPHYLOCOCCUS SPECIES (COAGULASE NEGATIVE) Performed at [facility]    Report Status 10/02/2014 FINAL  Final   Organism ID, Bacteria STAPHYLOCOCCUS SPECIES (COAGULASE NEGATIVE)  Final      Susceptibility   Staphylococcus species (coagulase negative) - MIC*    CIPROFLOXACIN >=8 RESISTANT Resistant     GENTAMICIN <=0.5 SENSITIVE Sensitive     NITROFURANTOIN <=16 SENSITIVE Sensitive     OXACILLIN >=4 RESISTANT Resistant     TETRACYCLINE 2 SENSITIVE Sensitive     VANCOMYCIN <=0.5 SENSITIVE Sensitive     TRIMETH/SULFA <=10 SENSITIVE Sensitive     CLINDAMYCIN <=0.25 SENSITIVE Sensitive     RIFAMPIN <=0.5 SENSITIVE Sensitive     Inducible Clindamycin NEGATIVE Sensitive     * >=100,000 COLONIES/mL STAPHYLOCOCCUS SPECIES (COAGULASE NEGATIVE)    [x]  Treated with Cephalexin, organism resistant to prescribed antimicrobial  New antibiotic prescription: Fosfomycin 3 gm PO x 1. Stop taking Cephalexin.  ED Provider: Clayton Bibles, PA-C   Horatio Pel 10/03/2014, 9:22 AM Infectious Diseases Pharmacist Phone# [phone]

## 2014-10-04 ENCOUNTER — Telehealth (HOSPITAL_COMMUNITY): Payer: Self-pay | Admitting: Emergency Medicine

## 2014-10-04 NOTE — Telephone Encounter (Signed)
Post ED Visit - Positive Culture Follow-up: Unsuccessful Patient Follow-up  Culture assessed and recommendations reviewed by: []  Wes Dulaney, Pharm.D., BCPS []  Heide Guile, Pharm.D., BCPS []  Alycia Rossetti, Pharm.D., BCPS []  [place], Pharm.D., BCPS, AAHIVP []  Legrand Como, Pharm.D., BCPS, AAHIVP []  Hassie Bruce, Pharm.D. [x]  Milus Glazier, [place].D.  Positive Urine culture  []  Patient discharged without antimicrobial prescription and treatment is now indicated [x]  Organism is resistant to prescribed ED discharge antimicrobial []  Patient with positive blood cultures   Phone number provided in epic invalid,  letter will be sent to address on file  Ernesta Amble 10/04/2014, 12:41 PM

## 2014-10-10 ENCOUNTER — Telehealth (HOSPITAL_BASED_OUTPATIENT_CLINIC_OR_DEPARTMENT_OTHER): Payer: Self-pay | Admitting: Emergency Medicine

## 2014-10-10 NOTE — Telephone Encounter (Signed)
Post ED Visit - Positive Culture Follow-up: Successful Patient Follow-Up  Culture assessed and recommendations reviewed by: []  Heide Guile, Pharm.D., BCPS-AQ ID []  Alycia Rossetti, Pharm.D., BCPS []  [place], Pharm.D., BCPS, AAHIVP []  Legrand Como, Pharm.D., BCPS, AAHIVP [x]  [place], Pharm.D. []  Milus Glazier, Pharm.D.  Positive urine culture Staphylococcus  []  Patient discharged without antimicrobial prescription and treatment is now indicated []  Organism is resistant to prescribed ED discharge antimicrobial []  Patient with positive blood cultures  Changes discussed with ED provider: Clayton Bibles PA New antibiotic prescription stop Keflex, start Fosfomycin 3 gm po x 1 Called to [facility] patient, 10/10/14 1325   Hazle Nordmann 10/10/2014, 3:53 PM

## 2014-10-24 ENCOUNTER — Emergency Department (HOSPITAL_BASED_OUTPATIENT_CLINIC_OR_DEPARTMENT_OTHER)
Admission: EM | Admit: 2014-10-24 | Discharge: 2014-10-24 | Disposition: A | Discharge status: Home / Self Care | Payer: Medicare Other | Attending: Emergency Medicine | Admitting: Emergency Medicine

## 2014-10-24 ENCOUNTER — Emergency Department (HOSPITAL_BASED_OUTPATIENT_CLINIC_OR_DEPARTMENT_OTHER): Payer: Medicare Other

## 2014-10-24 ENCOUNTER — Encounter (HOSPITAL_BASED_OUTPATIENT_CLINIC_OR_DEPARTMENT_OTHER): Payer: Self-pay | Admitting: Emergency Medicine

## 2014-10-24 DIAGNOSIS — Z8719 Personal history of other diseases of the digestive system: Secondary | ICD-10-CM | POA: Diagnosis not present

## 2014-10-24 DIAGNOSIS — M1990 Unspecified osteoarthritis, unspecified site: Secondary | ICD-10-CM | POA: Insufficient documentation

## 2014-10-24 DIAGNOSIS — Z87442 Personal history of urinary calculi: Secondary | ICD-10-CM | POA: Diagnosis not present

## 2014-10-24 DIAGNOSIS — N3090 Cystitis, unspecified without hematuria: Secondary | ICD-10-CM | POA: Insufficient documentation

## 2014-10-24 DIAGNOSIS — E785 Hyperlipidemia, unspecified: Secondary | ICD-10-CM | POA: Insufficient documentation

## 2014-10-24 DIAGNOSIS — Z85828 Personal history of other malignant neoplasm of skin: Secondary | ICD-10-CM | POA: Diagnosis not present

## 2014-10-24 DIAGNOSIS — R109 Unspecified abdominal pain: Secondary | ICD-10-CM | POA: Insufficient documentation

## 2014-10-24 DIAGNOSIS — Z87891 Personal history of nicotine dependence: Secondary | ICD-10-CM | POA: Diagnosis not present

## 2014-10-24 DIAGNOSIS — Z792 Long term (current) use of antibiotics: Secondary | ICD-10-CM | POA: Insufficient documentation

## 2014-10-24 DIAGNOSIS — R52 Pain, unspecified: Secondary | ICD-10-CM | POA: Diagnosis present

## 2014-10-24 DIAGNOSIS — Z79899 Other long term (current) drug therapy: Secondary | ICD-10-CM | POA: Diagnosis not present

## 2014-10-24 LAB — BASIC METABOLIC PANEL
ANION GAP: 5 (ref 5–15)
BUN: 12 mg/dL (ref 6–20)
CALCIUM: 8.7 mg/dL — AB (ref 8.9–10.3)
CO2: 26 mmol/L (ref 22–32)
Chloride: 107 mmol/L (ref 101–111)
Creatinine, Ser: 1.02 mg/dL (ref 0.61–1.24)
Glucose, Bld: 131 mg/dL — ABNORMAL HIGH (ref 65–99)
Potassium: 3.8 mmol/L (ref 3.5–5.1)
Sodium: 138 mmol/L (ref 135–145)

## 2014-10-24 LAB — CBC WITH DIFFERENTIAL/PLATELET
BASOS PCT: 0 %
Basophils Absolute: 0 10*3/uL (ref 0.0–0.1)
Eosinophils Absolute: 0.2 10*3/uL (ref 0.0–0.7)
Eosinophils Relative: 3 %
HEMATOCRIT: 38.2 % — AB (ref 39.0–52.0)
Hemoglobin: 12.8 g/dL — ABNORMAL LOW (ref 13.0–17.0)
Lymphocytes Relative: 25 %
Lymphs Abs: 1.3 10*3/uL (ref 0.7–4.0)
MCH: 32.1 pg (ref 26.0–34.0)
MCHC: 33.5 g/dL (ref 30.0–36.0)
MCV: 95.7 fL (ref 78.0–100.0)
Monocytes Absolute: 0.6 10*3/uL (ref 0.1–1.0)
Monocytes Relative: 11 %
NEUTROS ABS: 3.1 10*3/uL (ref 1.7–7.7)
NEUTROS PCT: 61 %
Platelets: 187 10*3/uL (ref 150–400)
RBC: 3.99 MIL/uL — AB (ref 4.22–5.81)
RDW: 12.3 % (ref 11.5–15.5)
WBC: 5.1 10*3/uL (ref 4.0–10.5)

## 2014-10-24 LAB — URINALYSIS, ROUTINE W REFLEX MICROSCOPIC
Bilirubin Urine: NEGATIVE
Glucose, UA: NEGATIVE mg/dL
Hgb urine dipstick: NEGATIVE
Ketones, ur: NEGATIVE mg/dL
NITRITE: POSITIVE — AB
Protein, ur: NEGATIVE mg/dL
Specific Gravity, Urine: 1.012 (ref 1.005–1.030)
UROBILINOGEN UA: 1 mg/dL (ref 0.0–1.0)
pH: 6.5 (ref 5.0–8.0)

## 2014-10-24 LAB — URINE MICROSCOPIC-ADD ON

## 2014-10-24 MED ORDER — CIPROFLOXACIN HCL 500 MG PO TABS
500.0000 mg | ORAL_TABLET | Freq: Once | ORAL | Status: AC
  Administered 2014-10-24: 500 mg via ORAL
  Filled 2014-10-24: qty 1

## 2014-10-24 MED ORDER — SODIUM CHLORIDE 0.9 % IV BOLUS (SEPSIS)
1000.0000 mL | Freq: Once | INTRAVENOUS | Status: AC
  Administered 2014-10-24: 1000 mL via INTRAVENOUS

## 2014-10-24 MED ORDER — CIPROFLOXACIN HCL 500 MG PO TABS: 500.0000 mg | ORAL_TABLET | Freq: Two times a day (BID) | ORAL | Status: DC

## 2014-10-24 MED ORDER — OXYCODONE-ACETAMINOPHEN 5-325 MG PO TABS
2.0000 | ORAL_TABLET | Freq: Once | ORAL | Status: AC
  Administered 2014-10-24: 2 via ORAL
  Filled 2014-10-24: qty 2

## 2014-10-24 NOTE — Discharge Instructions (Signed)
You were evaluated in the ED today and found to have a UTI. You'll be treated for this with an antibiotic. This antibiotic is different from the one you had earlier. Please take all of your antibiotics as prescribed. Follow-up with your primary care doctor in 1 week for reevaluation. Return to ED for worsening symptoms.  Urinary Tract Infection Urinary tract infections (UTIs) can develop anywhere along your urinary tract. Your urinary tract is your body's drainage system for removing wastes and extra water. Your urinary tract includes two kidneys, two ureters, a bladder, and a urethra. Your kidneys are a pair of bean-shaped organs. Each kidney is about the size of your fist. They are located below your ribs, one on each side of your spine. CAUSES Infections are caused by microbes, which are microscopic organisms, including fungi, viruses, and bacteria. These organisms are so small that they can only be seen through a microscope. Bacteria are the microbes that most commonly cause UTIs. SYMPTOMS  Symptoms of UTIs may vary by age and gender of the patient and by the location of the infection. Symptoms in young women typically include a frequent and intense urge to urinate and a painful, burning feeling in the bladder or urethra during urination. Older women and men are more likely to be tired, shaky, and weak and have muscle aches and abdominal pain. A fever may mean the infection is in your kidneys. Other symptoms of a kidney infection include pain in your back or sides below the ribs, nausea, and vomiting. DIAGNOSIS To diagnose a UTI, your caregiver will ask you about your symptoms. Your caregiver will also ask you to provide a urine sample. The urine sample will be tested for bacteria and white blood cells. White blood cells are made by your body to help fight infection. TREATMENT  Typically, UTIs can be treated with medication. Because most UTIs are caused by a bacterial infection, they usually can be  treated with the use of antibiotics. The choice of antibiotic and length of treatment depend on your symptoms and the type of bacteria causing your infection. HOME CARE INSTRUCTIONS  If you were prescribed antibiotics, take them exactly as your caregiver instructs you. Finish the medication even if you feel better after you have only taken some of the medication.  Drink enough water and fluids to keep your urine clear or pale yellow.  Avoid caffeine, tea, and carbonated beverages. They tend to irritate your bladder.  Empty your bladder often. Avoid holding urine for long periods of time.  Empty your bladder before and after sexual intercourse.  After a bowel movement, women should cleanse from front to back. Use each tissue only once. SEEK MEDICAL CARE IF:   You have back pain.  You develop a fever.  Your symptoms do not begin to resolve within 3 days. SEEK IMMEDIATE MEDICAL CARE IF:   You have severe back pain or lower abdominal pain.  You develop chills.  You have nausea or vomiting.  You have continued burning or discomfort with urination. MAKE SURE YOU:   Understand these instructions.  Will watch your condition.  Will get help right away if you are not doing well or get worse.   This information is not intended to replace advice given to you by your health care provider. Make sure you discuss any questions you have with your health care provider.   Document Released: 10/13/2004 Document Revised: 09/24/2014 Document Reviewed: 02/11/2011 Elsevier Interactive Patient Education [employer].

## 2014-10-24 NOTE — ED Notes (Signed)
Got flu shot yesterday and woke up today with body aches, fever,chills and coughing.

## 2014-10-24 NOTE — ED Provider Notes (Signed)
CSN: [id number]     Arrival date & time 10/24/14  1957 History   First MD Initiated Contact with Patient 10/24/14 2016     Chief Complaint  Patient presents with  . Generalized Body Aches     (Consider location/radiation/quality/duration/timing/severity/associated sxs/prior Treatment) HPI Casey Moreno is a 74 y.o. male who comes in for evaluation of generalized body aches. Patient reports that he got a flu shot yesterday and woke up today with body aches, intermittent fevers and chills, coughing and abdominal discomfort. He denies any nausea or vomiting, shortness of breath or chest pain, no hemoptysis. Reports his abdominal discomfort is bilateral and along his waistline, comes and goes and is intermittently sharp. He rates his overall discomfort as an 8/10. He has not tried anything to improve his symptoms. Nothing seems to make it better or worse. No other aggravating or modifying factors.  Past Medical History  Diagnosis Date  . Hypertension   . Hyperlipidemia   . Cancer ([place])   . Skin cancer   . Arthritis   . Osteoarthritis   . Renal disorder   . Kidney stones   . Diverticulosis    Past Surgical History  Procedure Laterality Date  . Skin biopsy    . Hernia repair    . Prostate ablation     No family history on file. Social History  Substance Use Topics  . Smoking status: Former Research scientist (life sciences)  . Smokeless tobacco: None  . Alcohol Use: No    Review of Systems A 10 point review of systems was completed and was negative except for pertinent positives and negatives as mentioned in the history of present illness     Allergies  Pneumococcal vaccines; Sulfa antibiotics; and Aspirin  Home Medications   Prior to Admission medications   Medication Sig Start Date End Date Taking? Authorizing Provider  albuterol (PROVENTIL HFA;VENTOLIN HFA) 108 (90 BASE) MCG/ACT inhaler Inhale 2 puffs into the lungs every 4 (four) hours as needed for wheezing or shortness of breath.     Historical Provider, MD  amLODipine (NORVASC) 5 MG tablet Take 5 mg by mouth daily.    Historical Provider, MD  atorvastatin (LIPITOR) 40 MG tablet Take 40 mg by mouth daily.    Historical Provider, MD  baclofen (LIORESAL) 10 MG tablet Take 10 mg by mouth 2 (two) times daily.    Historical Provider, MD  buPROPion (WELLBUTRIN) 100 MG tablet Take 30 mg by mouth once.    Historical Provider, MD  carvedilol (COREG) 3.125 MG tablet Take 3.125 mg by mouth 2 (two) times daily with a meal.    Historical Provider, MD  cephALEXin (KEFLEX) 500 MG capsule Take 1 capsule (500 mg total) by mouth 2 (two) times daily. 09/30/14   John Molpus, MD  ciprofloxacin (CIPRO) 500 MG tablet Take 1 tablet (500 mg total) by mouth 2 (two) times daily. 10/24/14   Comer Locket, PA-C  DULoxetine (CYMBALTA) 60 MG capsule Take 60 mg by mouth daily.    Historical Provider, MD  lisinopril (PRINIVIL,ZESTRIL) 5 MG tablet Take 5 mg by mouth daily.    Historical Provider, MD  lubiprostone (AMITIZA) 8 MCG capsule Take 5 mcg by mouth 2 (two) times daily with a meal.    Historical Provider, MD  nitroGLYCERIN (NITROSTAT) 0.4 MG SL tablet Place 0.4 mg under the tongue every 5 (five) minutes as needed for chest pain.    Historical Provider, MD  omeprazole (PRILOSEC) 40 MG capsule Take 40 mg by mouth daily.  Historical Provider, MD  ondansetron (ZOFRAN ODT) 8 MG disintegrating tablet Take 1 tablet (8 mg total) by mouth every 8 (eight) hours as needed for nausea or vomiting. 09/30/14   Shanon Rosser, MD  ranitidine (ZANTAC) 75 MG tablet Take 1 tablet (75 mg total) by mouth 2 (two) times daily. 06/24/13   Serita Grit, MD  sulfamethoxazole-trimethoprim (BACTRIM DS,SEPTRA DS) 800-160 MG per tablet Take 1 tablet by mouth 2 (two) times daily.    Historical Provider, MD  tamsulosin (FLOMAX) 0.4 MG CAPS capsule Take 0.4 mg by mouth daily.    Historical Provider, MD  traZODone (DESYREL) 100 MG tablet Take 100 mg by mouth at bedtime.    Historical  Provider, MD   BP 136/99 mmHg  Pulse 80  Temp(Src) 99.6 F (37.6 C) (Rectal)  Resp 18  Wt 176 lb (79.833 kg)  SpO2 99% Physical Exam  Constitutional: He is oriented to person, place, and time. He appears well-developed and well-nourished.  HENT:  Head: Normocephalic and atraumatic.  Mouth/Throat: Oropharynx is clear and moist.  Eyes: Conjunctivae are normal. Pupils are equal, round, and reactive to light. Right eye exhibits no discharge. Left eye exhibits no discharge. No scleral icterus.  Neck: Neck supple.  Cardiovascular: Normal rate, regular rhythm and normal heart sounds.   Pulmonary/Chest: Effort normal and breath sounds normal. No respiratory distress. He has no wheezes. He has no rales.  Abdominal: Soft.  Diffuse abdominal discomfort with deep palpation. No focal abdominal tenderness. Abdomen is otherwise soft, nondistended. No rebound or guarding. No other lesions or deformities noted.  Musculoskeletal: He exhibits no tenderness.  Neurological: He is alert and oriented to person, place, and time.  Cranial Nerves II-XII grossly intact  Skin: Skin is warm and dry. No rash noted.  Psychiatric: He has a normal mood and affect.  Nursing note and vitals reviewed.   ED Course  Procedures (including critical care time) Labs Review Labs Reviewed  BASIC METABOLIC PANEL - Abnormal; Notable for the following:    Glucose, Bld 131 (*)    Calcium 8.7 (*)    All other components within normal limits  CBC WITH DIFFERENTIAL/PLATELET - Abnormal; Notable for the following:    RBC 3.99 (*)    Hemoglobin 12.8 (*)    HCT 38.2 (*)    All other components within normal limits  URINALYSIS, ROUTINE W REFLEX MICROSCOPIC (NOT AT [facility]) - Abnormal; Notable for the following:    Nitrite POSITIVE (*)    Leukocytes, UA SMALL (*)    All other components within normal limits  URINE MICROSCOPIC-ADD ON - Abnormal; Notable for the following:    Bacteria, UA FEW (*)    All other components within  normal limits  URINE CULTURE    Imaging Review Dg Chest 2 View  10/24/2014   CLINICAL DATA:  Headache cough and fever.  Duration 2 days.  EXAM: CHEST  2 VIEW  COMPARISON:  03/1914  FINDINGS: There is mild chronic appearing interstitial coarsening. No confluent airspace opacities. No effusions. Hilar and mediastinal contours are unremarkable unchanged.  IMPRESSION: No active cardiopulmonary disease.   Electronically Signed   By: Andreas Newport M.D.   On: 10/24/2014 21:24   Ct Renal Stone Study  10/24/2014   CLINICAL DATA:  Patient with lower abdominal pain for 1 hour. History of renal stones.  EXAM: CT ABDOMEN AND PELVIS WITHOUT CONTRAST  TECHNIQUE: Multidetector CT imaging of the abdomen and pelvis was performed following the standard protocol without IV contrast.  COMPARISON:  CT 07/10/2014  FINDINGS: Lower chest: Normal heart size. Dependent atelectasis within the bilateral lower lobes.  Hepatobiliary: Liver is normal in size contour. Gallbladder is decompressed. No intrahepatic or extrahepatic biliary ductal dilatation.  Pancreas: Unremarkable  Spleen: Unremarkable  Adrenals/Urinary Tract: The adrenal glands are normal. Kidneys are symmetric in size. No hydronephrosis. Unchanged 2 mm calcification within the superior pole of the right kidney. Diverticulum off of the left aspect of the urinary bladder.  Stomach/Bowel: No abnormal bowel wall thickening or evidence for bowel obstruction. No free fluid or free intraperitoneal air. Small hiatal hernia.  Vascular/Lymphatic: Peripheral calcified atherosclerotic plaque involving a normal caliber abdominal aorta. No retroperitoneal lymphadenopathy.  Other: Prostate is enlarged with central dystrophic calcifications.  Musculoskeletal: Lower lumbar spine degenerative changes. No aggressive or acute appearing osseous lesions.  IMPRESSION: No acute process within the abdomen pelvis.   Electronically Signed   By: Lovey Newcomer M.D.   On: 10/24/2014 22:08   I have  personally reviewed and evaluated these images and lab results as part of my medical decision-making.   EKG Interpretation None     Meds given in ED:  Medications  sodium chloride 0.9 % bolus 1,000 mL (0 mLs Intravenous Stopped 10/24/14 2142)  oxyCODONE-acetaminophen (PERCOCET/ROXICET) 5-325 MG per tablet 2 tablet (2 tablets Oral Given 10/24/14 2142)  ciprofloxacin (CIPRO) tablet 500 mg (500 mg Oral Given 10/24/14 2229)    Discharge Medication List as of 10/24/2014 10:16 PM    START taking these medications   Details  ciprofloxacin (CIPRO) 500 MG tablet Take 1 tablet (500 mg total) by mouth 2 (two) times daily., Starting 10/24/2014, Until Discontinued, Print       Filed Vitals:   10/24/14 2002 10/24/14 2121 10/24/14 2234  BP: 161/88 153/83 136/99  Pulse: 109 84 80  Temp: 98.3 F (36.8 C) 99.6 F (37.6 C)   TempSrc: Oral Rectal   Resp: 22 20 18   Weight: 176 lb (79.833 kg)    SpO2: 98% 99% 99%    MDM  Vitals stable -afebrile Original tachycardia has responded well to IV fluids, 1 L normal saline. Pt resting comfortably in ED. PE--patient with diffuse lower abdominal discomfort. No peritoneal signs or other evidence of surgical abdomen. Labwork-patient has evidence of UTI on urinalysis-nitrite positive with small leukocytes. Urine culture obtained. Imaging-Will obtain a noncontrast CT abdomen in order to evaluate for possible stone and further infection. CT renal stone study shows no acute process within the abdomen pelvis.  Patient seen in ED on 9/12 and diagnosed with UTI. No cultures obtained at that time. Patient today with symptoms consistent with cystitis. Will initiate patient on Cipro therapy. Given antibiotics in the ED. We'll discharge with same. Overall, patient appears well, nontoxic and is appropriate for discharge. No evidence of other acute or emergent pathology at this time. Encourage patient to follow-up PCP within 48 hours for reevaluation. Prior to patient  discharge, I discussed and reviewed this case with Dr. Maryan Rued, who also saw and evaluated the patient.   Final diagnoses:  Cystitis        Comer Locket, PA-C 10/25/14 1233  Blanchie Dessert, MD 10/29/14 2042

## 2014-10-28 LAB — URINE CULTURE

## 2014-10-29 ENCOUNTER — Telehealth (HOSPITAL_BASED_OUTPATIENT_CLINIC_OR_DEPARTMENT_OTHER): Payer: Self-pay | Admitting: Emergency Medicine

## 2014-10-29 NOTE — Progress Notes (Signed)
ED Antimicrobial Stewardship Positive Culture Follow Up   BRINTON BRANDEL is an 74 y.o. male who presented to [facility] on 10/24/2014 with a chief complaint of  Chief Complaint  Patient presents with  . Generalized Body Aches    Recent Results (from the past 720 hour(s))  Urine culture     Status: None   Collection Time: 09/29/14  9:55 PM  Result Value Ref Range Status   Specimen Description URINE, CLEAN CATCH  Final   Special Requests NONE  Final   Culture   Final    >=100,000 COLONIES/mL STAPHYLOCOCCUS SPECIES (COAGULASE NEGATIVE) Performed at [facility]    Report Status 10/02/2014 FINAL  Final   Organism ID, Bacteria STAPHYLOCOCCUS SPECIES (COAGULASE NEGATIVE)  Final      Susceptibility   Staphylococcus species (coagulase negative) - MIC*    CIPROFLOXACIN >=8 RESISTANT Resistant     GENTAMICIN <=0.5 SENSITIVE Sensitive     NITROFURANTOIN <=16 SENSITIVE Sensitive     OXACILLIN >=4 RESISTANT Resistant     TETRACYCLINE 2 SENSITIVE Sensitive     VANCOMYCIN <=0.5 SENSITIVE Sensitive     TRIMETH/SULFA <=10 SENSITIVE Sensitive     CLINDAMYCIN <=0.25 SENSITIVE Sensitive     RIFAMPIN <=0.5 SENSITIVE Sensitive     Inducible Clindamycin NEGATIVE Sensitive     * >=100,000 COLONIES/mL STAPHYLOCOCCUS SPECIES (COAGULASE NEGATIVE)  Urine culture     Status: None   Collection Time: 10/24/14  8:45 PM  Result Value Ref Range Status   Specimen Description URINE, CLEAN CATCH  Final   Special Requests NONE  Final   Culture   Final    >=100,000 COLONIES/mL STAPHYLOCOCCUS SPECIES (COAGULASE NEGATIVE) Performed at [facility]    Report Status 10/28/2014 FINAL  Final   Organism ID, Bacteria STAPHYLOCOCCUS SPECIES (COAGULASE NEGATIVE)  Final      Susceptibility   Staphylococcus species (coagulase negative) - MIC*    CIPROFLOXACIN >=8 RESISTANT Resistant     GENTAMICIN <=0.5 SENSITIVE Sensitive     NITROFURANTOIN <=16 SENSITIVE Sensitive     OXACILLIN >=4 RESISTANT Resistant      TETRACYCLINE 2 SENSITIVE Sensitive     VANCOMYCIN <=0.5 SENSITIVE Sensitive     TRIMETH/SULFA <=10 SENSITIVE Sensitive     CLINDAMYCIN <=0.25 SENSITIVE Sensitive     RIFAMPIN <=0.5 SENSITIVE Sensitive     Inducible Clindamycin NEGATIVE Sensitive     * >=100,000 COLONIES/mL STAPHYLOCOCCUS SPECIES (COAGULASE NEGATIVE)    [x]  Treated with Ciprofloxacin, organism resistant to prescribed antimicrobial  New antibiotic prescription: Minocycline 100 mg bid x 10 days  ED Provider: Sula Rumple, PA-C  Assessment: 71 yoM presenting to ED 10/7 with complaints of generalized body aches, abdominal discomfort bilateral around waistline, and intermittently sharp pain. Of note, pt has previous ED visit 9/12 where diagnosed with UTI and give Cephelexin. Abx was changed to fosphomycin based off cultures results (Coag negative staph). UCx now again growing coag negative staph resistant to cipro. Plan to stop cipro and start Minocycline 100 mg bid x 10 days.  Darl Pikes, PharmD Clinical Pharmacist- Resident Pager: [phone]  Darl Pikes 10/29/2014, 9:02 AM Infectious Diseases Pharmacist Phone# [phone]

## 2014-10-29 NOTE — Telephone Encounter (Signed)
Post ED Visit - Positive Culture Follow-up: Successful Patient Follow-Up  Culture assessed and recommendations reviewed by: []  Heide Guile, Pharm.D., BCPS-AQ ID []  Alycia Rossetti, Pharm.D., BCPS []  [place], Pharm.D., BCPS, AAHIVP []  Legrand Como, Pharm.D., BCPS, AAHIVP []  [place], Pharm.D. []  Milus Glazier, [place].D. [place] PharmD  Positive urine culture Staph  []  Patient discharged without antimicrobial prescription and treatment is now indicated [x]  Organism is resistant to prescribed ED discharge antimicrobial []  Patient with positive blood cultures  Changes discussed with ED provider: Will Porfirio Mylar PA New antibiotic prescription Stop cipro, minocycline 100mg  po bid x 10 days Called to [facility] [address]  Contacted patient, 10/29/14 1404   Casey Moreno 10/29/2014, 2:02 PM

## 2015-01-10 ENCOUNTER — Emergency Department (HOSPITAL_BASED_OUTPATIENT_CLINIC_OR_DEPARTMENT_OTHER)
Admission: EM | Admit: 2015-01-10 | Discharge: 2015-01-10 | Disposition: A | Discharge status: Home / Self Care | Payer: Medicare Other | Attending: Emergency Medicine | Admitting: Emergency Medicine

## 2015-01-10 ENCOUNTER — Emergency Department (HOSPITAL_BASED_OUTPATIENT_CLINIC_OR_DEPARTMENT_OTHER): Payer: Medicare Other

## 2015-01-10 ENCOUNTER — Encounter (HOSPITAL_BASED_OUTPATIENT_CLINIC_OR_DEPARTMENT_OTHER): Payer: Self-pay | Admitting: *Deleted

## 2015-01-10 DIAGNOSIS — I10 Essential (primary) hypertension: Secondary | ICD-10-CM | POA: Insufficient documentation

## 2015-01-10 DIAGNOSIS — R079 Chest pain, unspecified: Secondary | ICD-10-CM | POA: Diagnosis not present

## 2015-01-10 DIAGNOSIS — Z8719 Personal history of other diseases of the digestive system: Secondary | ICD-10-CM | POA: Insufficient documentation

## 2015-01-10 DIAGNOSIS — M25512 Pain in left shoulder: Secondary | ICD-10-CM | POA: Diagnosis not present

## 2015-01-10 DIAGNOSIS — Z87442 Personal history of urinary calculi: Secondary | ICD-10-CM | POA: Insufficient documentation

## 2015-01-10 DIAGNOSIS — Z79899 Other long term (current) drug therapy: Secondary | ICD-10-CM | POA: Diagnosis not present

## 2015-01-10 DIAGNOSIS — Z87891 Personal history of nicotine dependence: Secondary | ICD-10-CM | POA: Insufficient documentation

## 2015-01-10 DIAGNOSIS — Z87448 Personal history of other diseases of urinary system: Secondary | ICD-10-CM | POA: Diagnosis not present

## 2015-01-10 DIAGNOSIS — E785 Hyperlipidemia, unspecified: Secondary | ICD-10-CM | POA: Insufficient documentation

## 2015-01-10 DIAGNOSIS — M542 Cervicalgia: Secondary | ICD-10-CM | POA: Diagnosis not present

## 2015-01-10 DIAGNOSIS — R51 Headache: Secondary | ICD-10-CM | POA: Insufficient documentation

## 2015-01-10 DIAGNOSIS — Z85828 Personal history of other malignant neoplasm of skin: Secondary | ICD-10-CM | POA: Insufficient documentation

## 2015-01-10 DIAGNOSIS — M25511 Pain in right shoulder: Secondary | ICD-10-CM | POA: Diagnosis not present

## 2015-01-10 DIAGNOSIS — R519 Headache, unspecified: Secondary | ICD-10-CM

## 2015-01-10 DIAGNOSIS — H9201 Otalgia, right ear: Secondary | ICD-10-CM | POA: Insufficient documentation

## 2015-01-10 LAB — CBC
HCT: 38.7 % — ABNORMAL LOW (ref 39.0–52.0)
HEMOGLOBIN: 12.8 g/dL — AB (ref 13.0–17.0)
MCH: 30.8 pg (ref 26.0–34.0)
MCHC: 33.1 g/dL (ref 30.0–36.0)
MCV: 93 fL (ref 78.0–100.0)
PLATELETS: 210 10*3/uL (ref 150–400)
RBC: 4.16 MIL/uL — AB (ref 4.22–5.81)
RDW: 12.6 % (ref 11.5–15.5)
WBC: 5.7 10*3/uL (ref 4.0–10.5)

## 2015-01-10 LAB — BASIC METABOLIC PANEL
ANION GAP: 5 (ref 5–15)
BUN: 13 mg/dL (ref 6–20)
CALCIUM: 8.8 mg/dL — AB (ref 8.9–10.3)
CO2: 28 mmol/L (ref 22–32)
CREATININE: 0.98 mg/dL (ref 0.61–1.24)
Chloride: 107 mmol/L (ref 101–111)
Glucose, Bld: 106 mg/dL — ABNORMAL HIGH (ref 65–99)
Potassium: 3.8 mmol/L (ref 3.5–5.1)
SODIUM: 140 mmol/L (ref 135–145)

## 2015-01-10 LAB — TROPONIN I: TROPONIN I: 0.03 ng/mL (ref <0.031)

## 2015-01-10 MED ORDER — SODIUM CHLORIDE 0.9 % IV BOLUS (SEPSIS)
1000.0000 mL | Freq: Once | INTRAVENOUS | Status: AC
  Administered 2015-01-10: 1000 mL via INTRAVENOUS

## 2015-01-10 MED ORDER — METOCLOPRAMIDE HCL 5 MG/ML IJ SOLN
10.0000 mg | Freq: Once | INTRAMUSCULAR | Status: AC
  Administered 2015-01-10: 10 mg via INTRAVENOUS
  Filled 2015-01-10: qty 2

## 2015-01-10 MED ORDER — CYCLOBENZAPRINE HCL 5 MG PO TABS: 5.0000 mg | ORAL_TABLET | Freq: Three times a day (TID) | ORAL | Status: DC | PRN

## 2015-01-10 MED ORDER — DIPHENHYDRAMINE HCL 50 MG/ML IJ SOLN
25.0000 mg | Freq: Once | INTRAMUSCULAR | Status: AC
  Administered 2015-01-10: 25 mg via INTRAVENOUS
  Filled 2015-01-10: qty 1

## 2015-01-10 NOTE — ED Notes (Signed)
SpCO read 1 on monitor. MD aware.

## 2015-01-10 NOTE — ED Notes (Signed)
HR rhythm irregular with PACs noted.

## 2015-01-10 NOTE — ED Notes (Signed)
Implemented MD order set for chest pain

## 2015-01-10 NOTE — ED Notes (Signed)
Safety measures in place, wife at bedside

## 2015-01-10 NOTE — ED Notes (Signed)
Pt resting quietly at this time

## 2015-01-10 NOTE — ED Notes (Signed)
Presents today with HA for the past week, primarily left side and radiates into shoulders, having difficulty sleeping. States "having a little bit of chest pain" Anterior at epigastric region, non-radiating. Also c/o ear aches

## 2015-01-10 NOTE — Discharge Instructions (Signed)

## 2015-01-10 NOTE — ED Notes (Signed)
Pt remains on cont cardiac monitor with cont POX and int NBP monitoring.

## 2015-01-10 NOTE — ED Provider Notes (Signed)
CSN: [id number]     Arrival date & time 01/10/15  1124 History  By signing my name below, I, Casey Moreno, attest that this documentation has been prepared under the direction and in the presence of Quintella Reichert, MD. Electronically Signed: Starleen Moreno, ED Scribe. 01/10/2015. 4:11 PM.   Chief Complaint  Patient presents with  . Headache  . Ear Fullness  . Chest Pain   The history is provided by the patient. No language interpreter was used.   HPI Comments: Casey Moreno is a 74 y.o. male with hx as noted below who presents to the Emergency Department complaining of a constant, throbbing, bilateral, sudden onset headache onset 1 week ago without injury.  He also notes pain into the neck/bilateral shoulders, right ear pain, and slight central and left CP with no aggravating/alleviating factors onset 5 hours ago.  The wife reports a similar headache that she attributes to a recent surgery.  The patient's home is heated with gas and the wife smokes tobacco in the house, but there is no CO detector.  He denies hx of similar episodes.  He denies fever, vomiting, vision changes, cough, SOB, abdominal pain, vomiting, diarrhea, dizziness, numbness, weakness.  Patient reports an allergy to sulfa and ASA.      Past Medical History  Diagnosis Date  . Hypertension   . Hyperlipidemia   . Cancer ([place])   . Skin cancer   . Arthritis   . Osteoarthritis   . Renal disorder   . Kidney stones   . Diverticulosis    Past Surgical History  Procedure Laterality Date  . Skin biopsy    . Hernia repair    . Prostate ablation     History reviewed. No pertinent family history. Social History  Substance Use Topics  . Smoking status: Former Research scientist (life sciences)  . Smokeless tobacco: None  . Alcohol Use: No    Review of Systems A complete 10 system review of systems was obtained and all systems are negative except as noted in the HPI and PMH.   Allergies  Pneumococcal vaccines; Sulfa antibiotics; and  Aspirin  Home Medications   Prior to Admission medications   Medication Sig Start Date End Date Taking? Authorizing Provider  albuterol (PROVENTIL HFA;VENTOLIN HFA) 108 (90 BASE) MCG/ACT inhaler Inhale 2 puffs into the lungs every 4 (four) hours as needed for wheezing or shortness of breath.   Yes Historical Provider, MD  amLODipine (NORVASC) 5 MG tablet Take 5 mg by mouth daily.   Yes Historical Provider, MD  atorvastatin (LIPITOR) 40 MG tablet Take 40 mg by mouth daily.   Yes Historical Provider, MD  buPROPion (WELLBUTRIN) 100 MG tablet Take 30 mg by mouth once.   Yes Historical Provider, MD  carvedilol (COREG) 3.125 MG tablet Take 3.125 mg by mouth 2 (two) times daily with a meal.   Yes Historical Provider, MD  nitroGLYCERIN (NITROSTAT) 0.4 MG SL tablet Place 0.4 mg under the tongue every 5 (five) minutes as needed for chest pain.   Yes Historical Provider, MD  omeprazole (PRILOSEC) 40 MG capsule Take 40 mg by mouth daily.   Yes Historical Provider, MD  tamsulosin (FLOMAX) 0.4 MG CAPS capsule Take 0.4 mg by mouth daily.   Yes Historical Provider, MD  cyclobenzaprine (FLEXERIL) 5 MG tablet Take 1 tablet (5 mg total) by mouth 3 (three) times daily as needed for muscle spasms. 01/10/15   Quintella Reichert, MD  lubiprostone (AMITIZA) 8 MCG capsule Take 5 mcg by mouth 2 (  two) times daily with a meal.    Historical Provider, MD   BP 138/96 mmHg  Pulse 80  Temp(Src) 98.2 F (36.8 C) (Oral)  Resp 18  Ht 6\' 2"  (1.88 m)  Wt 185 lb (83.915 kg)  BMI 23.74 kg/m2  SpO2 98% Physical Exam  Constitutional: He is oriented to person, place, and time. He appears well-developed and well-nourished.  HENT:  Head: Normocephalic and atraumatic.  Right Ear: External ear normal.  Left Ear: External ear normal.  Mouth/Throat: No oropharyngeal exudate.  Eyes: EOM are normal. Pupils are equal, round, and reactive to light.  Cardiovascular: Normal rate and regular rhythm.   No murmur heard. Pulmonary/Chest:  Effort normal and breath sounds normal. No respiratory distress.  Abdominal: Soft. There is no tenderness. There is no rebound and no guarding.  Musculoskeletal: He exhibits no edema or tenderness.  Neurological: He is alert and oriented to person, place, and time. No cranial nerve deficit. Coordination normal.  Skin: Skin is warm and dry.  Psychiatric: He has a normal mood and affect. His behavior is normal.  Nursing note and vitals reviewed.   ED Course  Procedures (including critical care time)  DIAGNOSTIC STUDIES: Oxygen Saturation is 100% on RA, normal by my interpretation.    COORDINATION OF CARE:  4:11 PM Will order head CT scan and migraine cocktail.  Patient acknowledges and agrees with plan.    Labs Review Labs Reviewed  BASIC METABOLIC PANEL - Abnormal; Notable for the following:    Glucose, Bld 106 (*)    Calcium 8.8 (*)    All other components within normal limits  CBC - Abnormal; Notable for the following:    RBC 4.16 (*)    Hemoglobin 12.8 (*)    HCT 38.7 (*)    All other components within normal limits  TROPONIN I    Imaging Review Dg Chest 2 View  01/10/2015  CLINICAL DATA:  74 year old male with chest pain in the anterior epigastric region EXAM: CHEST  2 VIEW COMPARISON:  Prior chest x-ray 10/24/2014 FINDINGS: Stable cardiac and mediastinal contours. Central bronchitic change in mild interstitial prominence remain unchanged and chronic in appearance. No focal airspace consolidation, pleural effusion, pulmonary edema or pneumothorax. No acute osseous abnormality. IMPRESSION: Stable chest x-ray without evidence of acute cardiopulmonary process. Electronically Signed   By: Jacqulynn Cadet M.D.   On: 01/10/2015 15:42   Ct Head Wo Contrast  01/10/2015  CLINICAL DATA:  Pt complains of posterior headache x 1 week, denies injury, denies n/v, denies blurred vision, gait is steady No previous hx cva,seizure or MI per pt Not photophobic EXAM: CT HEAD WITHOUT CONTRAST  TECHNIQUE: Contiguous axial images were obtained from the base of the skull through the vertex without intravenous contrast. COMPARISON:  MR 05/31/2013 FINDINGS: Opacification the left sphenoid sinus with some internal high attenuation and scattered calcifications. Atherosclerotic and physiologic intracranial calcifications. Mild parenchymal atrophy. Patchy areas of hypoattenuation in deep and periventricular white matter bilaterally. Negative for acute intracranial hemorrhage, mass lesion, acute infarction, midline shift, or mass-effect. Acute infarct may be inapparent on noncontrast CT. Ventricles and sulci symmetric. Bone windows demonstrate no focal lesion. IMPRESSION: 1. Negative for bleed or other acute intracranial process. 2. Atrophy and nonspecific white matter changes. 3. Chronic sphenoid sinus disease. Electronically Signed   By: Lucrezia Europe M.D.   On: 01/10/2015 16:30   I have personally reviewed and evaluated these images and lab results as part of my medical decision-making.   EKG Interpretation  Date/Time:  Saturday January 10 2015 15:17:03 EST Ventricular Rate:  100 PR Interval:  181 QRS Duration: 105 QT Interval:  388 QTC Calculation: 500 R Axis:   -33 Text Interpretation:  Sinus tachycardia Left axis deviation Anterior  infarct, old Nonspecific T abnormalities, lateral leads Prolonged QT  interval Confirmed by Hazle Coca [phone]) on 01/10/2015 6:05:32 PM      MDM   Final diagnoses:  Bad headache   Patient here for evaluation of headache that didn't present for the last week. History and examination is not consistent with subarachnoid hemorrhage, meningitis, temporal arteritis. His headache is significantly improved after headache cocktail. CO toxicity not likely.  Patient here for headache, psychosis likely related to muscle spasm from in his upper back causing tension type headache. Treating both muscle relaxants at home. Patient did have some chest pain that appears to be  musculoskeletal in nature. Cardiac workup initiated in triage process. Presentation is not consistent with acute MI, PE, dissection.  I personally performed the services described in this documentation, which was scribed in my presence. The recorded information has been reviewed and is accurate.    Quintella Reichert, MD 01/11/15 [phone]

## 2015-01-10 NOTE — ED Notes (Signed)
Pt states still has ant chest pain with pain into shoulders and left side of head.

## 2015-01-10 NOTE — ED Notes (Signed)
PT NOTED TO HAVE A PAIN PATCH AT RT SCAPULA, states is for back pain due to disc issues

## 2015-01-10 NOTE — ED Notes (Signed)
Placed on cardiac monitor with cont POX and cont ECG monitoring with int NBP monitoring

## 2015-01-10 NOTE — ED Notes (Signed)
Per pt report HA for week with sinus pressure, ear pain and coughing, insomnia, nose running.

## 2015-01-15 ENCOUNTER — Emergency Department (HOSPITAL_BASED_OUTPATIENT_CLINIC_OR_DEPARTMENT_OTHER)
Admission: EM | Admit: 2015-01-15 | Discharge: 2015-01-16 | Disposition: A | Discharge status: Home / Self Care | Payer: Medicare Other | Attending: Emergency Medicine | Admitting: Emergency Medicine

## 2015-01-15 ENCOUNTER — Encounter (HOSPITAL_BASED_OUTPATIENT_CLINIC_OR_DEPARTMENT_OTHER): Payer: Self-pay | Admitting: *Deleted

## 2015-01-15 DIAGNOSIS — Z87442 Personal history of urinary calculi: Secondary | ICD-10-CM | POA: Diagnosis not present

## 2015-01-15 DIAGNOSIS — Z79899 Other long term (current) drug therapy: Secondary | ICD-10-CM | POA: Insufficient documentation

## 2015-01-15 DIAGNOSIS — Z8719 Personal history of other diseases of the digestive system: Secondary | ICD-10-CM | POA: Insufficient documentation

## 2015-01-15 DIAGNOSIS — Z87448 Personal history of other diseases of urinary system: Secondary | ICD-10-CM | POA: Insufficient documentation

## 2015-01-15 DIAGNOSIS — Z8739 Personal history of other diseases of the musculoskeletal system and connective tissue: Secondary | ICD-10-CM | POA: Diagnosis not present

## 2015-01-15 DIAGNOSIS — Z87891 Personal history of nicotine dependence: Secondary | ICD-10-CM | POA: Insufficient documentation

## 2015-01-15 DIAGNOSIS — R112 Nausea with vomiting, unspecified: Secondary | ICD-10-CM | POA: Diagnosis not present

## 2015-01-15 DIAGNOSIS — I10 Essential (primary) hypertension: Secondary | ICD-10-CM | POA: Diagnosis not present

## 2015-01-15 DIAGNOSIS — Z85828 Personal history of other malignant neoplasm of skin: Secondary | ICD-10-CM | POA: Diagnosis not present

## 2015-01-15 LAB — CBC WITH DIFFERENTIAL/PLATELET
BASOS ABS: 0 10*3/uL (ref 0.0–0.1)
Basophils Relative: 0 %
EOS PCT: 1 %
Eosinophils Absolute: 0.1 10*3/uL (ref 0.0–0.7)
HEMATOCRIT: 41 % (ref 39.0–52.0)
Hemoglobin: 13.4 g/dL (ref 13.0–17.0)
LYMPHS ABS: 1.9 10*3/uL (ref 0.7–4.0)
LYMPHS PCT: 31 %
MCH: 30.4 pg (ref 26.0–34.0)
MCHC: 32.7 g/dL (ref 30.0–36.0)
MCV: 93 fL (ref 78.0–100.0)
Monocytes Absolute: 0.7 10*3/uL (ref 0.1–1.0)
Monocytes Relative: 11 %
NEUTROS ABS: 3.6 10*3/uL (ref 1.7–7.7)
Neutrophils Relative %: 57 %
Platelets: 226 10*3/uL (ref 150–400)
RBC: 4.41 MIL/uL (ref 4.22–5.81)
RDW: 12.7 % (ref 11.5–15.5)
WBC: 6.2 10*3/uL (ref 4.0–10.5)

## 2015-01-15 MED ORDER — ONDANSETRON HCL 4 MG/2ML IJ SOLN
4.0000 mg | Freq: Once | INTRAMUSCULAR | Status: AC
  Administered 2015-01-16: 4 mg via INTRAVENOUS
  Filled 2015-01-15: qty 2

## 2015-01-15 MED ORDER — SODIUM CHLORIDE 0.9 % IV BOLUS (SEPSIS)
1000.0000 mL | Freq: Once | INTRAVENOUS | Status: AC
  Administered 2015-01-16: 1000 mL via INTRAVENOUS

## 2015-01-15 NOTE — ED Provider Notes (Addendum)
CSN: [id number]     Arrival date & time 01/15/15  2047 History  By signing my name below, I, Casey Moreno, attest that this documentation has been prepared under the direction and in the presence of Shanon Rosser, MD. Electronically Signed: Meriel Moreno, ED Scribe. 01/15/2015. 11:43 PM.   Chief Complaint  Patient presents with  . Vomiting    The history is provided by the patient. No language interpreter was used.   HPI Comments: Casey Moreno is a 74 y.o. male who presents to the Emergency Department complaining of constant nausea onset 2 days ago after facial surgery. He was not placed under general anesthesia for the surgery but thinks he may have had a bad reaction to the local anesthetic; he has had reactions to local anesthetic in the past. He developed persistent vomiting onset today. He denies abdominal pain or diarrhea. Vomiting is exacerbated by drinking fluids.  Past Medical History  Diagnosis Date  . Hypertension   . Hyperlipidemia   . Cancer ([place])   . Skin cancer   . Arthritis   . Osteoarthritis   . Renal disorder   . Kidney stones   . Diverticulosis    Past Surgical History  Procedure Laterality Date  . Skin biopsy    . Hernia repair    . Prostate ablation     No family history on file. Social History  Substance Use Topics  . Smoking status: Former Research scientist (life sciences)  . Smokeless tobacco: None  . Alcohol Use: No    Review of Systems A complete 10 system review of systems was obtained and is otherwise negative except at noted in the HPI and PMH.  Allergies  Pneumococcal vaccines; Sulfa antibiotics; and Aspirin  Home Medications   Prior to Admission medications   Medication Sig Start Date End Date Taking? Authorizing Provider  albuterol (PROVENTIL HFA;VENTOLIN HFA) 108 (90 BASE) MCG/ACT inhaler Inhale 2 puffs into the lungs every 4 (four) hours as needed for wheezing or shortness of breath.    Historical Provider, MD  amLODipine (NORVASC) 5 MG tablet Take 5 mg  by mouth daily.    Historical Provider, MD  atorvastatin (LIPITOR) 40 MG tablet Take 40 mg by mouth daily.    Historical Provider, MD  buPROPion (WELLBUTRIN) 100 MG tablet Take 30 mg by mouth once.    Historical Provider, MD  carvedilol (COREG) 3.125 MG tablet Take 3.125 mg by mouth 2 (two) times daily with a meal.    Historical Provider, MD  cyclobenzaprine (FLEXERIL) 5 MG tablet Take 1 tablet (5 mg total) by mouth 3 (three) times daily as needed for muscle spasms. 01/10/15   Quintella Reichert, MD  lubiprostone (AMITIZA) 8 MCG capsule Take 5 mcg by mouth 2 (two) times daily with a meal.    Historical Provider, MD  nitroGLYCERIN (NITROSTAT) 0.4 MG SL tablet Place 0.4 mg under the tongue every 5 (five) minutes as needed for chest pain.    Historical Provider, MD  omeprazole (PRILOSEC) 40 MG capsule Take 40 mg by mouth daily.    Historical Provider, MD  tamsulosin (FLOMAX) 0.4 MG CAPS capsule Take 0.4 mg by mouth daily.    Historical Provider, MD   BP 129/93 mmHg  Pulse 55  Temp(Src) 97.7 F (36.5 C) (Oral)  Resp 20  Ht 6\' 1"  (1.854 m)  Wt 185 lb (83.915 kg)  BMI 24.41 kg/m2  SpO2 98% Physical Exam  Nursing note and vitals reviewed. General: Well-developed, well-nourished male in no acute distress; appearance  consistent with age of record HENT: normocephalic; sutured surgical incision to left forehead and temple, no signs of infection   Eyes: pupils equal, round and reactive to light; extraocular muscles intact; left upper eyelid edema and ptosis status post surgery Neck: supple Heart: regular rate and rhythm Lungs: clear to auscultation bilaterally Abdomen: soft; nondistended; nontender; no masses or hepatosplenomegaly; bowel sounds present Extremities: No deformity; full range of motion Neurologic: Awake, alert and oriented; motor function intact in all extremities and symmetric; no facial droop Skin: Warm and dry Psychiatric: Normal mood and affect  ED Course  Procedures   DIAGNOSTIC STUDIES: Oxygen Saturation is 98% on RA, normal by my interpretation.    COORDINATION OF CARE: 11:42 PM Discussed treatment plan with pt. Pt acknowledges and agrees to plan.    MDM   Nursing notes and vitals signs, including pulse oximetry, reviewed.  Summary of this visit's results, reviewed by myself:  Labs:  Results for orders placed or performed during the hospital encounter of 01/15/15 (from the past 24 hour(s))  CBC with Differential/Platelet     Status: None   Collection Time: 01/15/15 11:30 PM  Result Value Ref Range   WBC 6.2 4.0 - 10.5 K/uL   RBC 4.41 4.22 - 5.81 MIL/uL   Hemoglobin 13.4 13.0 - 17.0 g/dL   HCT 41.0 39.0 - 52.0 %   MCV 93.0 78.0 - 100.0 fL   MCH 30.4 26.0 - 34.0 pg   MCHC 32.7 30.0 - 36.0 g/dL   RDW 12.7 11.5 - 15.5 %   Platelets 226 150 - 400 K/uL   Neutrophils Relative % 57 %   Neutro Abs 3.6 1.7 - 7.7 K/uL   Lymphocytes Relative 31 %   Lymphs Abs 1.9 0.7 - 4.0 K/uL   Monocytes Relative 11 %   Monocytes Absolute 0.7 0.1 - 1.0 K/uL   Eosinophils Relative 1 %   Eosinophils Absolute 0.1 0.0 - 0.7 K/uL   Basophils Relative 0 %   Basophils Absolute 0.0 0.0 - 0.1 K/uL  Basic metabolic panel     Status: Abnormal   Collection Time: 01/15/15 11:30 PM  Result Value Ref Range   Sodium 141 135 - 145 mmol/L   Potassium 3.9 3.5 - 5.1 mmol/L   Chloride 107 101 - 111 mmol/L   CO2 26 22 - 32 mmol/L   Glucose, Bld 111 (H) 65 - 99 mg/dL   BUN 23 (H) 6 - 20 mg/dL   Creatinine, Ser 1.21 0.61 - 1.24 mg/dL   Calcium 9.1 8.9 - 10.3 mg/dL   GFR calc non Af Amer 58 (L) >60 mL/min   GFR calc Af Amer >60 >60 mL/min   Anion gap 8 5 - 15   12:52 AM Drinking fluids without emesis after IV fluid bolus and Zofran.  I personally performed the services described in this documentation, which was scribed in my presence. The recorded information has been reviewed and is accurate.   Shanon Rosser, MD 01/16/15 [id number]  Shanon Rosser, MD 01/16/15 [phone]

## 2015-01-15 NOTE — ED Notes (Signed)
Vomiting x 2 hours. States he had a skin cancer removed from his left eye 2 days ago. Has been nauseated since.

## 2015-01-16 DIAGNOSIS — R112 Nausea with vomiting, unspecified: Secondary | ICD-10-CM | POA: Diagnosis not present

## 2015-01-16 LAB — BASIC METABOLIC PANEL
ANION GAP: 8 (ref 5–15)
BUN: 23 mg/dL — ABNORMAL HIGH (ref 6–20)
CALCIUM: 9.1 mg/dL (ref 8.9–10.3)
CO2: 26 mmol/L (ref 22–32)
Chloride: 107 mmol/L (ref 101–111)
Creatinine, Ser: 1.21 mg/dL (ref 0.61–1.24)
GFR, EST NON AFRICAN AMERICAN: 58 mL/min — AB (ref >60)
GLUCOSE: 111 mg/dL — AB (ref 65–99)
POTASSIUM: 3.9 mmol/L (ref 3.5–5.1)
Sodium: 141 mmol/L (ref 135–145)

## 2015-01-16 MED ORDER — ONDANSETRON 8 MG PO TBDP: 8.0000 mg | ORAL_TABLET | Freq: Three times a day (TID) | ORAL | Status: DC | PRN

## 2015-01-16 NOTE — Discharge Instructions (Signed)
Nausea, Adult °Nausea is the feeling that you have an upset stomach or have to vomit. Nausea by itself is not likely a serious concern, but it may be an early sign of more serious medical problems. As nausea gets worse, it can lead to vomiting. If vomiting develops, there is the risk of dehydration.  °CAUSES  °· Viral infections. °· Food poisoning. °· Medicines. °· Pregnancy. °· Motion sickness. °· Migraine headaches. °· Emotional distress. °· Severe pain from any source. °· Alcohol intoxication. °HOME CARE INSTRUCTIONS °· Get plenty of rest. °· Ask your caregiver about specific rehydration instructions. °· Eat small amounts of food and sip liquids more often. °· Take all medicines as told by your caregiver. °SEEK MEDICAL CARE IF: °· You have not improved after 2 days, or you get worse. °· You have a headache. °SEEK IMMEDIATE MEDICAL CARE IF:  °· You have a fever. °· You faint. °· You keep vomiting or have blood in your vomit. °· You are extremely weak or dehydrated. °· You have dark or bloody stools. °· You have severe chest or abdominal pain. °MAKE SURE YOU: °· Understand these instructions. °· Will watch your condition. °· Will get help right away if you are not doing well or get worse. °  °This information is not intended to replace advice given to you by your health care provider. Make sure you discuss any questions you have with your health care provider. °  °Document Released: 02/11/2004 Document Revised: 01/24/2014 Document Reviewed: 09/15/2010 °Elsevier Interactive Patient Education ©2016 Else[employer] ° °

## 2015-02-06 ENCOUNTER — Emergency Department (INDEPENDENT_AMBULATORY_CARE_PROVIDER_SITE_OTHER)
Admission: EM | Admit: 2015-02-06 | Discharge: 2015-02-06 | Disposition: A | Discharge status: Home / Self Care | Payer: Medicare Other | Source: Home / Self Care | Attending: Family Medicine | Admitting: Family Medicine

## 2015-02-06 ENCOUNTER — Encounter: Payer: Self-pay | Admitting: *Deleted

## 2015-02-06 DIAGNOSIS — G43809 Other migraine, not intractable, without status migrainosus: Secondary | ICD-10-CM | POA: Diagnosis not present

## 2015-02-06 MED ORDER — ONDANSETRON 8 MG PO TBDP: 8.0000 mg | ORAL_TABLET | Freq: Three times a day (TID) | ORAL | Status: DC | PRN

## 2015-02-06 MED ORDER — KETOROLAC TROMETHAMINE 30 MG/ML IJ SOLN
30.0000 mg | Freq: Once | INTRAMUSCULAR | Status: AC
  Administered 2015-02-06: 30 mg via INTRAMUSCULAR

## 2015-02-06 MED ORDER — METOCLOPRAMIDE HCL 5 MG/ML IJ SOLN
5.0000 mg | Freq: Once | INTRAMUSCULAR | Status: AC
  Administered 2015-02-06: 5 mg via INTRAMUSCULAR

## 2015-02-06 MED ORDER — DEXAMETHASONE SODIUM PHOSPHATE 10 MG/ML IJ SOLN
10.0000 mg | Freq: Once | INTRAMUSCULAR | Status: AC
  Administered 2015-02-06: 10 mg via INTRAMUSCULAR

## 2015-02-06 NOTE — ED Notes (Signed)
Pt c/o HA x 4 days unrelieved by tylenol or IBF. 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 Site is still red. He called their office who could not see him today and advised him to be evaluated. Also c/o both hands burning and itching since yesterday.

## 2015-02-06 NOTE — Discharge Instructions (Signed)
If symptoms become significantly worse during the night or over the weekend, proceed to the local emergency room.    Recurrent Migraine Headache A migraine headache is an intense, throbbing pain on one or both sides of your head. Recurrent migraines keep coming back. A migraine can last for 30 minutes to several hours. CAUSES  The exact cause of a migraine headache is not always known. However, a migraine may be caused when nerves in the brain become irritated and release chemicals that cause inflammation. This causes pain. Certain things may also trigger migraines, such as:   Alcohol.  Smoking.  Stress.  Menstruation.  Aged cheeses.  Foods or drinks that contain nitrates, glutamate, aspartame, or tyramine.  Lack of sleep.  Chocolate.  Caffeine.  Hunger.  Physical exertion.  Fatigue.  Medicines used to treat chest pain (nitroglycerine), birth control pills, estrogen, and some blood pressure medicines. SYMPTOMS   Pain on one or both sides of your head.  Pulsating or throbbing pain.  Severe pain that prevents daily activities.  Pain that is aggravated by any physical activity.  Nausea, vomiting, or both.  Dizziness.  Pain with exposure to bright lights, loud noises, or activity.  General sensitivity to bright lights, loud noises, or smells. Before you get a migraine, you may get warning signs that a migraine is coming (aura). An aura may include:  Seeing flashing lights.  Seeing bright spots, halos, or zigzag lines.  Having tunnel vision or blurred vision.  Having feelings of numbness or tingling.  Having trouble talking.  Having muscle weakness. DIAGNOSIS  A recurrent migraine headache is often diagnosed based on:  Symptoms.  Physical examination.  A CT scan or MRI of your head. These imaging tests cannot diagnose migraines but can help rule out other causes of headaches.  TREATMENT  Medicines may be given for pain and nausea. Medicines can  also be given to help prevent recurrent migraines. HOME CARE INSTRUCTIONS  Only take over-the-counter or prescription medicines for pain or discomfort as directed by your health care provider. The use of long-term narcotics is not recommended.  Lie down in a dark, quiet room when you have a migraine.  Keep a journal to find out what may trigger your migraine headaches. For example, write down:  What you eat and drink.  How much sleep you get.  Any change to your diet or medicines.  Limit alcohol consumption.  Quit smoking if you smoke.  Get 7-9 hours of sleep, or as recommended by your health care provider.  Limit stress.  Keep lights dim if bright lights bother you and make your migraines worse. SEEK MEDICAL CARE IF:   You do not get relief from the medicines given to you.  You have a recurrence of pain.  You have a fever. SEEK IMMEDIATE MEDICAL CARE IF:  Your migraine becomes severe.  You have a stiff neck.  You have loss of vision.  You have muscular weakness or loss of muscle control.  You start losing your balance or have trouble walking.  You feel faint or pass out.  You have severe symptoms that are different from your first symptoms. MAKE SURE YOU:   Understand these instructions.  Will watch your condition.  Will get help right away if you are not doing well or get worse.   This information is not intended to replace advice given to you by your health care provider. Make sure you discuss any questions you have with your health care provider.  Document Released: 09/28/2000 Document Revised: 01/24/2014 Document Reviewed: 09/10/2012 Elsevier Interactive Patient Education [employer].

## 2015-02-06 NOTE — ED Provider Notes (Signed)
CSN: [id number]     Arrival date & time 02/06/15  1443 History   First MD Initiated Contact with Patient 02/06/15 1609     Chief Complaint  Patient presents with  . Headache      HPI Comments: Patient complains of onset of a biparietal constant headache that started four days ago, without neurologic symptoms.   He has a history of occasional similar migraine headaches, with his last episode about 2 years ago.    Patient is a 75 y.o. male presenting with migraines. The history is provided by the patient and a relative.  Migraine This is a recurrent problem. Episode onset: 4 days ago. The problem occurs constantly. The problem has not changed since onset.Associated symptoms include headaches. Nothing aggravates the symptoms. Nothing relieves the symptoms. He has tried nothing for the symptoms.    Past Medical History  Diagnosis Date  . Hypertension   . Hyperlipidemia   . Cancer ([place])   . Skin cancer   . Arthritis   . Osteoarthritis   . Renal disorder   . Kidney stones   . Diverticulosis    Past Surgical History  Procedure Laterality Date  . Skin biopsy    . Hernia repair    . Prostate ablation     History reviewed. No pertinent family history. Social History  Substance Use Topics  . Smoking status: Former Research scientist (life sciences)  . Smokeless tobacco: None  . Alcohol Use: No    Review of Systems  Constitutional: Negative for fever, chills, diaphoresis and fatigue.  HENT: Negative for congestion, facial swelling, sinus pressure and sore throat.   Eyes: Negative.   Respiratory: Negative.   Cardiovascular: Negative.   Gastrointestinal: Negative.   Genitourinary: Negative.   Musculoskeletal: Negative.   Neurological: Positive for headaches. Negative for dizziness, syncope, facial asymmetry, speech difficulty, weakness, light-headedness and numbness.    Allergies  Pneumococcal vaccines; Sulfa antibiotics; and Aspirin  Home Medications   Prior to Admission medications   Medication Sig  Start Date End Date Taking? Authorizing Provider  albuterol (PROVENTIL HFA;VENTOLIN HFA) 108 (90 BASE) MCG/ACT inhaler Inhale 2 puffs into the lungs every 4 (four) hours as needed for wheezing or shortness of breath.    Historical Provider, MD  amLODipine (NORVASC) 5 MG tablet Take 5 mg by mouth daily.    Historical Provider, MD  atorvastatin (LIPITOR) 40 MG tablet Take 40 mg by mouth daily.    Historical Provider, MD  buPROPion (WELLBUTRIN) 100 MG tablet Take 30 mg by mouth once.    Historical Provider, MD  carvedilol (COREG) 3.125 MG tablet Take 3.125 mg by mouth 2 (two) times daily with a meal.    Historical Provider, MD  cyclobenzaprine (FLEXERIL) 5 MG tablet Take 1 tablet (5 mg total) by mouth 3 (three) times daily as needed for muscle spasms. 01/10/15   Quintella Reichert, MD  lubiprostone (AMITIZA) 8 MCG capsule Take 5 mcg by mouth 2 (two) times daily with a meal.    Historical Provider, MD  nitroGLYCERIN (NITROSTAT) 0.4 MG SL tablet Place 0.4 mg under the tongue every 5 (five) minutes as needed for chest pain.    Historical Provider, MD  omeprazole (PRILOSEC) 40 MG capsule Take 40 mg by mouth daily.    Historical Provider, MD  ondansetron (ZOFRAN ODT) 8 MG disintegrating tablet Take 1 tablet (8 mg total) by mouth every 8 (eight) hours as needed for nausea or vomiting. 02/06/15   Kandra Nicolas, MD  tamsulosin (FLOMAX) 0.4 MG CAPS  capsule Take 0.4 mg by mouth daily.    Historical Provider, MD   Meds Ordered and Administered this Visit  Medications - No data to display  BP 131/83 mmHg  Pulse 92  Temp(Src) 98.8 F (37.1 C) (Oral)  Resp 16  Ht 6\' 1"  (1.854 m)  Wt 185 lb (83.915 kg)  BMI 24.41 kg/m2  SpO2 100% No data found.   Physical Exam Nursing notes and Vital Signs reviewed. Appearance:  Patient appears stated age, and in no acute distress Head:  Surgical site left temporal area well healed without swelling, erythema or tende                             Eyes:  Pupils are equal,  round, and reactive to light and accomodation.  Extraocular movement is intact.  Conjunctivae are not inflamed.  Fund benign, no photophobia. Ears:  Canals normal.  Tympanic membranes normal.  Nose:  Normal turbinates.  No sinus tenderness.    Pharynx:  Normal Neck:  Supple.  No adenopathy.  Carotids have normal upstrokes Lungs:  Clear to auscultation.  Breath sounds are equal.  Moving air well. Heart:   Irregular rhythm without murmurs, rubs, or gallops.  Abdomen:  Nontender without masses or hepatosplenomegaly.  Bowel sounds are present.  No CVA or flank tenderness.  Extremities:  No edema.  Skin:  No rash present.   Neurologic:  Cranial nerves 2 through 12 are normal.  Patellar, achilles, and elbow reflexes are normal.  Cerebellar function is intact (finger-to-nose and rapid alternating hand movement).  Gait and station are normal.  Grip strength symmetric bilaterally.    ED Course  Procedures none    MDM   1. Other migraine without status migrainosus, not intractable    Toradol 30mg  IM administered, plus Decadron 10mg  and Reglan 5mg  If symptoms become significantly worse during the night or over the weekend, proceed to the local emergency room.  Followup with Family Doctor if not improved in 3 days.    Kandra Nicolas, MD 02/07/15 [phone]

## 2015-02-08 ENCOUNTER — Emergency Department (HOSPITAL_BASED_OUTPATIENT_CLINIC_OR_DEPARTMENT_OTHER)
Admission: EM | Admit: 2015-02-08 | Discharge: 2015-02-08 | Disposition: A | Discharge status: Home / Self Care | Payer: Medicare Other | Attending: Emergency Medicine | Admitting: Emergency Medicine

## 2015-02-08 ENCOUNTER — Emergency Department (HOSPITAL_BASED_OUTPATIENT_CLINIC_OR_DEPARTMENT_OTHER): Payer: Medicare Other

## 2015-02-08 ENCOUNTER — Encounter (HOSPITAL_BASED_OUTPATIENT_CLINIC_OR_DEPARTMENT_OTHER): Payer: Self-pay | Admitting: *Deleted

## 2015-02-08 DIAGNOSIS — I10 Essential (primary) hypertension: Secondary | ICD-10-CM | POA: Diagnosis not present

## 2015-02-08 DIAGNOSIS — Z87442 Personal history of urinary calculi: Secondary | ICD-10-CM | POA: Insufficient documentation

## 2015-02-08 DIAGNOSIS — Z87448 Personal history of other diseases of urinary system: Secondary | ICD-10-CM | POA: Insufficient documentation

## 2015-02-08 DIAGNOSIS — E785 Hyperlipidemia, unspecified: Secondary | ICD-10-CM | POA: Insufficient documentation

## 2015-02-08 DIAGNOSIS — J209 Acute bronchitis, unspecified: Secondary | ICD-10-CM | POA: Diagnosis not present

## 2015-02-08 DIAGNOSIS — Z8719 Personal history of other diseases of the digestive system: Secondary | ICD-10-CM | POA: Insufficient documentation

## 2015-02-08 DIAGNOSIS — Z79899 Other long term (current) drug therapy: Secondary | ICD-10-CM | POA: Insufficient documentation

## 2015-02-08 DIAGNOSIS — Z87891 Personal history of nicotine dependence: Secondary | ICD-10-CM | POA: Insufficient documentation

## 2015-02-08 DIAGNOSIS — Z85828 Personal history of other malignant neoplasm of skin: Secondary | ICD-10-CM | POA: Insufficient documentation

## 2015-02-08 DIAGNOSIS — H578 Other specified disorders of eye and adnexa: Secondary | ICD-10-CM | POA: Diagnosis not present

## 2015-02-08 DIAGNOSIS — R05 Cough: Secondary | ICD-10-CM | POA: Diagnosis present

## 2015-02-08 DIAGNOSIS — Z8739 Personal history of other diseases of the musculoskeletal system and connective tissue: Secondary | ICD-10-CM | POA: Insufficient documentation

## 2015-02-08 MED ORDER — HYDROCOD POLST-CPM POLST ER 10-8 MG/5ML PO SUER
5.0000 mL | Freq: Once | ORAL | Status: AC
  Administered 2015-02-08: 5 mL via ORAL
  Filled 2015-02-08: qty 5

## 2015-02-08 MED ORDER — ALBUTEROL SULFATE HFA 108 (90 BASE) MCG/ACT IN AERS
2.0000 | INHALATION_SPRAY | RESPIRATORY_TRACT | Status: DC | PRN
  Administered 2015-02-08: 2 via RESPIRATORY_TRACT
  Filled 2015-02-08: qty 6.7

## 2015-02-08 MED ORDER — AEROCHAMBER PLUS W/MASK MISC
1.0000 | Freq: Once | Status: DC
  Filled 2015-02-08: qty 1

## 2015-02-08 MED ORDER — IPRATROPIUM-ALBUTEROL 0.5-2.5 (3) MG/3ML IN SOLN
3.0000 mL | RESPIRATORY_TRACT | Status: DC
  Administered 2015-02-08: 3 mL via RESPIRATORY_TRACT
  Filled 2015-02-08: qty 3

## 2015-02-08 MED ORDER — DOXYCYCLINE HYCLATE 100 MG PO CAPS: 100.0000 mg | ORAL_CAPSULE | Freq: Two times a day (BID) | ORAL | Status: DC

## 2015-02-08 MED ORDER — MUCINEX DM MAXIMUM STRENGTH 60-1200 MG PO TB12: 1.0000 | ORAL_TABLET | Freq: Two times a day (BID) | ORAL | Status: DC | PRN

## 2015-02-08 MED ORDER — HYDROCOD POLST-CPM POLST ER 10-8 MG/5ML PO SUER: 5.0000 mL | Freq: Once | ORAL | Status: DC

## 2015-02-08 MED ORDER — DOXYCYCLINE HYCLATE 100 MG PO TABS
100.0000 mg | ORAL_TABLET | Freq: Once | ORAL | Status: AC
  Administered 2015-02-08: 100 mg via ORAL
  Filled 2015-02-08: qty 1

## 2015-02-08 NOTE — ED Notes (Addendum)
  Pt c/o cough that started 3pm on Sat. Denies any fever. C/o runny nose/watery eye/ and sneezing. States his chest is "sore" from coughing. States cough is constant and dry. No wheezing noted on exam. States sob only when lying down. Pt states he used his inhaler without any relief.

## 2015-02-08 NOTE — ED Provider Notes (Signed)
CSN: [id number]     Arrival date & time 02/08/15  0417 History   First MD Initiated Contact with Patient 02/08/15 0435     Chief Complaint  Patient presents with  . Cough     (Consider location/radiation/quality/duration/timing/severity/associated sxs/prior Treatment) HPI  This is a 75 year old male who complains of a cough that began yesterday afternoon. The cough is occasionally productive of green sputum. He has had some shortness of breath when lying supine. He has not had a fever. His chest is sore from coughing. He has not been wheezing. He has not gotten relief from his home albuterol. Associated symptoms include rhinorrhea, nasal congestion, watering eyes and sneezing.  Past Medical History  Diagnosis Date  . Hypertension   . Hyperlipidemia   . Cancer ([place])   . Skin cancer   . Arthritis   . Osteoarthritis   . Diverticulosis   . Renal disorder   . Kidney stones    Past Surgical History  Procedure Laterality Date  . Skin biopsy    . Hernia repair    . Prostate ablation     No family history on file. Social History  Substance Use Topics  . Smoking status: Former Research scientist (life sciences)  . Smokeless tobacco: None  . Alcohol Use: No    Review of Systems  All other systems reviewed and are negative.   Allergies  Pneumococcal vaccines; Sulfa antibiotics; and Aspirin  Home Medications   Prior to Admission medications   Medication Sig Start Date End Date Taking? Authorizing Provider  albuterol (PROVENTIL HFA;VENTOLIN HFA) 108 (90 BASE) MCG/ACT inhaler Inhale 2 puffs into the lungs every 4 (four) hours as needed for wheezing or shortness of breath.    Historical Provider, MD  amLODipine (NORVASC) 5 MG tablet Take 5 mg by mouth daily.    Historical Provider, MD  atorvastatin (LIPITOR) 40 MG tablet Take 40 mg by mouth daily.    Historical Provider, MD  buPROPion (WELLBUTRIN) 100 MG tablet Take 30 mg by mouth once.    Historical Provider, MD  carvedilol (COREG) 3.125 MG tablet Take  3.125 mg by mouth 2 (two) times daily with a meal.    Historical Provider, MD  cyclobenzaprine (FLEXERIL) 5 MG tablet Take 1 tablet (5 mg total) by mouth 3 (three) times daily as needed for muscle spasms. 01/10/15   Quintella Reichert, MD  lubiprostone (AMITIZA) 8 MCG capsule Take 5 mcg by mouth 2 (two) times daily with a meal.    Historical Provider, MD  nitroGLYCERIN (NITROSTAT) 0.4 MG SL tablet Place 0.4 mg under the tongue every 5 (five) minutes as needed for chest pain.    Historical Provider, MD  omeprazole (PRILOSEC) 40 MG capsule Take 40 mg by mouth daily.    Historical Provider, MD  ondansetron (ZOFRAN ODT) 8 MG disintegrating tablet Take 1 tablet (8 mg total) by mouth every 8 (eight) hours as needed for nausea or vomiting. 02/06/15   Kandra Nicolas, MD  tamsulosin (FLOMAX) 0.4 MG CAPS capsule Take 0.4 mg by mouth daily.    Historical Provider, MD   BP 140/107 mmHg  Pulse 101  Temp(Src) 97.9 F (36.6 C) (Oral)  Resp 20  Ht 6\' 1"  (1.854 m)  Wt 185 lb (83.915 kg)  BMI 24.41 kg/m2  SpO2 98%   Physical Exam  General: Well-developed, well-nourished male in no acute distress; appearance consistent with age of record HENT: normocephalic; atraumatic Eyes: pupils equal, round and reactive to light; extraocular muscles intact; lens implants Neck: supple  Heart: regular rate and rhythm Lungs: clear to auscultation bilaterally; frequent cough Abdomen: soft; nondistended; nontender; bowel sounds present Extremities: No deformity; full range of motion Neurologic: Awake, alert and oriented; motor function intact in all extremities and symmetric; no facial droop Skin: Warm and dry Psychiatric: Normal mood and affect    ED Course  Procedures (including critical care time)   MDM  Nursing notes and vitals signs, including pulse oximetry, reviewed.  Summary of this visit's results, reviewed by myself:  Imaging Studies: Dg Chest 2 View  02/08/2015  CLINICAL DATA:  75 year old male with  cough EXAM: CHEST  2 VIEW COMPARISON:  Radiograph dated 01/10/2015 FINDINGS: Two views of the chest demonstrate mild increased vascular interstitial prominence with bilateral diffuse hazy airspace densities. Findings may represent a degree of congestive changes or edema. Small nodular density at the right lung base seen on the frontal projection noted. Pneumonia is not excluded. There is no focal consolidation, pleural effusion, or pneumothorax. Stable cardiac silhouette. No acute osseous pathology. IMPRESSION: Mild vascular and interstitial prominence, increased from prior study. Clinical correlation and follow-up recommended. Electronically Signed   By: Anner Crete M.D.   On: 02/08/2015 05:05   5:17 AM Air movement improved, patient feels better after DuoNeb treatment. Given x-ray findings and productive cough we will treat with an antibiotic.    Shanon Rosser, MD 02/08/15 [phone]

## 2015-02-08 NOTE — ED Notes (Signed)
Resp at bedside for breathing treatment

## 2015-02-08 NOTE — ED Notes (Signed)
Transported to xray 

## 2015-02-08 NOTE — Discharge Instructions (Signed)

## 2015-02-21 ENCOUNTER — Emergency Department (HOSPITAL_BASED_OUTPATIENT_CLINIC_OR_DEPARTMENT_OTHER)
Admission: EM | Admit: 2015-02-21 | Discharge: 2015-02-21 | Disposition: A | Discharge status: Home / Self Care | Payer: Medicare Other | Attending: Emergency Medicine | Admitting: Emergency Medicine

## 2015-02-21 ENCOUNTER — Encounter (HOSPITAL_BASED_OUTPATIENT_CLINIC_OR_DEPARTMENT_OTHER): Payer: Self-pay | Admitting: *Deleted

## 2015-02-21 ENCOUNTER — Emergency Department (HOSPITAL_BASED_OUTPATIENT_CLINIC_OR_DEPARTMENT_OTHER): Payer: Medicare Other

## 2015-02-21 DIAGNOSIS — E785 Hyperlipidemia, unspecified: Secondary | ICD-10-CM | POA: Diagnosis not present

## 2015-02-21 DIAGNOSIS — Z87442 Personal history of urinary calculi: Secondary | ICD-10-CM | POA: Diagnosis not present

## 2015-02-21 DIAGNOSIS — Z79899 Other long term (current) drug therapy: Secondary | ICD-10-CM | POA: Diagnosis not present

## 2015-02-21 DIAGNOSIS — Z87448 Personal history of other diseases of urinary system: Secondary | ICD-10-CM | POA: Insufficient documentation

## 2015-02-21 DIAGNOSIS — R0602 Shortness of breath: Secondary | ICD-10-CM | POA: Diagnosis present

## 2015-02-21 DIAGNOSIS — I509 Heart failure, unspecified: Secondary | ICD-10-CM | POA: Insufficient documentation

## 2015-02-21 DIAGNOSIS — Z8739 Personal history of other diseases of the musculoskeletal system and connective tissue: Secondary | ICD-10-CM | POA: Diagnosis not present

## 2015-02-21 DIAGNOSIS — Z85828 Personal history of other malignant neoplasm of skin: Secondary | ICD-10-CM | POA: Insufficient documentation

## 2015-02-21 DIAGNOSIS — Z87891 Personal history of nicotine dependence: Secondary | ICD-10-CM | POA: Insufficient documentation

## 2015-02-21 DIAGNOSIS — Z8719 Personal history of other diseases of the digestive system: Secondary | ICD-10-CM | POA: Insufficient documentation

## 2015-02-21 DIAGNOSIS — I10 Essential (primary) hypertension: Secondary | ICD-10-CM | POA: Insufficient documentation

## 2015-02-21 DIAGNOSIS — D649 Anemia, unspecified: Secondary | ICD-10-CM | POA: Diagnosis not present

## 2015-02-21 LAB — COMPREHENSIVE METABOLIC PANEL
ALK PHOS: 55 U/L (ref 38–126)
ALT: 16 U/L — AB (ref 17–63)
AST: 29 U/L (ref 15–41)
Albumin: 3.8 g/dL (ref 3.5–5.0)
Anion gap: 7 (ref 5–15)
BUN: 10 mg/dL (ref 6–20)
CALCIUM: 8.6 mg/dL — AB (ref 8.9–10.3)
CO2: 25 mmol/L (ref 22–32)
CREATININE: 0.91 mg/dL (ref 0.61–1.24)
Chloride: 108 mmol/L (ref 101–111)
GFR calc Af Amer: >60 mL/min (ref >60)
Glucose, Bld: 106 mg/dL — ABNORMAL HIGH (ref 65–99)
Potassium: 3.8 mmol/L (ref 3.5–5.1)
Sodium: 140 mmol/L (ref 135–145)
Total Bilirubin: 0.8 mg/dL (ref 0.3–1.2)
Total Protein: 6.8 g/dL (ref 6.5–8.1)

## 2015-02-21 LAB — CBC WITH DIFFERENTIAL/PLATELET
BASOS ABS: 0 10*3/uL (ref 0.0–0.1)
Basophils Relative: 0 %
Eosinophils Absolute: 0.1 10*3/uL (ref 0.0–0.7)
Eosinophils Relative: 1 %
HCT: 36.5 % — ABNORMAL LOW (ref 39.0–52.0)
HEMOGLOBIN: 12 g/dL — AB (ref 13.0–17.0)
LYMPHS ABS: 2.2 10*3/uL (ref 0.7–4.0)
LYMPHS PCT: 26 %
MCH: 31.3 pg (ref 26.0–34.0)
MCHC: 32.9 g/dL (ref 30.0–36.0)
MCV: 95.3 fL (ref 78.0–100.0)
Monocytes Absolute: 0.9 10*3/uL (ref 0.1–1.0)
Monocytes Relative: 10 %
NEUTROS ABS: 5.4 10*3/uL (ref 1.7–7.7)
NEUTROS PCT: 63 %
Platelets: 295 10*3/uL (ref 150–400)
RBC: 3.83 MIL/uL — AB (ref 4.22–5.81)
RDW: 13.7 % (ref 11.5–15.5)
WBC: 8.6 10*3/uL (ref 4.0–10.5)

## 2015-02-21 LAB — BRAIN NATRIURETIC PEPTIDE: B Natriuretic Peptide: 457.9 pg/mL — ABNORMAL HIGH (ref 0.0–100.0)

## 2015-02-21 LAB — TROPONIN I: Troponin I: <0.03 ng/mL (ref <0.031)

## 2015-02-21 MED ORDER — FUROSEMIDE 40 MG PO TABS: 40.0000 mg | ORAL_TABLET | Freq: Every day | ORAL | Status: AC

## 2015-02-21 MED ORDER — FUROSEMIDE 10 MG/ML IJ SOLN
40.0000 mg | Freq: Once | INTRAMUSCULAR | Status: AC
  Administered 2015-02-21: 40 mg via INTRAVENOUS
  Filled 2015-02-21: qty 4

## 2015-02-21 NOTE — Discharge Instructions (Signed)
I have recommended you be admitted to the hospital for evaluation of your heart. Repeat change her mind at anytime, please return to the emergency department.  Heart Failure Heart failure is a condition in which the heart has trouble pumping blood. This means your heart does not pump blood efficiently for your body to work well. In some cases of heart failure, fluid may back up into your lungs or you may have swelling (edema) in your lower legs. Heart failure is usually a long-term (chronic) condition. It is important for you to take good care of yourself and follow your health care provider's treatment plan. CAUSES  Some health conditions can cause heart failure. Those health conditions include:  High blood pressure (hypertension). Hypertension causes the heart muscle to work harder than normal. When pressure in the blood vessels is high, the heart needs to pump (contract) with more force in order to circulate blood throughout the body. High blood pressure eventually causes the heart to become stiff and weak.  Coronary artery disease (CAD). CAD is the buildup of cholesterol and fat (plaque) in the arteries of the heart. The blockage in the arteries deprives the heart muscle of oxygen and blood. This can cause chest pain and may lead to a heart attack. High blood pressure can also contribute to CAD.  Heart attack (myocardial infarction). A heart attack occurs when one or more arteries in the heart become blocked. The loss of oxygen damages the muscle tissue of the heart. When this happens, part of the heart muscle dies. The injured tissue does not contract as well and weakens the heart's ability to pump blood.  Abnormal heart valves. When the heart valves do not open and close properly, it can cause heart failure. This makes the heart muscle pump harder to keep the blood flowing.  Heart muscle disease (cardiomyopathy or myocarditis). Heart muscle disease is damage to the heart muscle from a variety of  causes. These can include drug or alcohol abuse, infections, or unknown reasons. These can increase the risk of heart failure.  Lung disease. Lung disease makes the heart work harder because the lungs do not work properly. This can cause a strain on the heart, leading it to fail.  Diabetes. Diabetes increases the risk of heart failure. High blood sugar contributes to high fat (lipid) levels in the blood. Diabetes can also cause slow damage to tiny blood vessels that carry important nutrients to the heart muscle. When the heart does not get enough oxygen and food, it can cause the heart to become weak and stiff. This leads to a heart that does not contract efficiently.  Other conditions can contribute to heart failure. These include abnormal heart rhythms, thyroid problems, and low blood counts (anemia). Certain unhealthy behaviors can increase the risk of heart failure, including:  Being overweight.  Smoking or chewing tobacco.  Eating foods high in fat and cholesterol.  Abusing illicit drugs or alcohol.  Lacking physical activity. SYMPTOMS  Heart failure symptoms may vary and can be hard to detect. Symptoms may include:  Shortness of breath with activity, such as climbing stairs.  Persistent cough.  Swelling of the feet, ankles, legs, or abdomen.  Unexplained weight gain.  Difficulty breathing when lying flat (orthopnea).  Waking from sleep because of the need to sit up and get more air.  Rapid heartbeat.  Fatigue and loss of energy.  Feeling light-headed, dizzy, or close to fainting.  Loss of appetite.  Nausea.  Increased urination during the night (  nocturia). DIAGNOSIS  A diagnosis of heart failure is based on your history, symptoms, physical examination, and diagnostic tests. Diagnostic tests for heart failure may include:  Echocardiography.  Electrocardiography.  Chest X-ray.  Blood tests.  Exercise stress test.  Cardiac angiography.  Radionuclide  scans. TREATMENT  Treatment is aimed at managing the symptoms of heart failure. Medicines, behavioral changes, or surgical intervention may be necessary to treat heart failure.  Medicines to help treat heart failure may include:  Angiotensin-converting enzyme (ACE) inhibitors. This type of medicine blocks the effects of a blood protein called angiotensin-converting enzyme. ACE inhibitors relax (dilate) the blood vessels and help lower blood pressure.  Angiotensin receptor blockers (ARBs). This type of medicine blocks the actions of a blood protein called angiotensin. Angiotensin receptor blockers dilate the blood vessels and help lower blood pressure.  Water pills (diuretics). Diuretics cause the kidneys to remove salt and water from the blood. The extra fluid is removed through urination. This loss of extra fluid lowers the volume of blood the heart pumps.  Beta blockers. These prevent the heart from beating too fast and improve heart muscle strength.  Digitalis. This increases the force of the heartbeat.  Healthy behavior changes include:  Obtaining and maintaining a healthy weight.  Stopping smoking or chewing tobacco.  Eating heart-healthy foods.  Limiting or avoiding alcohol.  Stopping illicit drug use.  Physical activity as directed by your health care provider.  Surgical treatment for heart failure may include:  A procedure to open blocked arteries, repair damaged heart valves, or remove damaged heart muscle tissue.  A pacemaker to improve heart muscle function and control certain abnormal heart rhythms.  An internal cardioverter defibrillator to treat certain serious abnormal heart rhythms.  A left ventricular assist device (LVAD) to assist the pumping ability of the heart. HOME CARE INSTRUCTIONS   Take medicines only as directed by your health care provider. Medicines are important in reducing the workload of your heart, slowing the progression of heart failure, and  improving your symptoms.  Do not stop taking your medicine unless directed by your health care provider.  Do not skip any dose of medicine.  Refill your prescriptions before you run out of medicine. Your medicines are needed every day.  Engage in moderate physical activity if directed by your health care provider. Moderate physical activity can benefit some people. The elderly and people with severe heart failure should consult with a health care provider for physical activity recommendations.  Eat heart-healthy foods. Food choices should be free of trans fat and low in saturated fat, cholesterol, and salt (sodium). Healthy choices include fresh or frozen fruits and vegetables, fish, lean meats, legumes, fat-free or low-fat dairy products, and whole grain or high fiber foods. Talk to a dietitian to learn more about heart-healthy foods.  Limit sodium if directed by your health care provider. Sodium restriction may reduce symptoms of heart failure in some people. Talk to a dietitian to learn more about heart-healthy seasonings.  Use healthy cooking methods. Healthy cooking methods include roasting, grilling, broiling, baking, poaching, steaming, or stir-frying. Talk to a dietitian to learn more about healthy cooking methods.  Limit fluids if directed by your health care provider. Fluid restriction may reduce symptoms of heart failure in some people.  Weigh yourself every day. Daily weights are important in the early recognition of excess fluid. You should weigh yourself every morning after you urinate and before you eat breakfast. Wear the same amount of clothing each time you weigh  yourself. Record your daily weight. Provide your health care provider with your weight record.  Monitor and record your blood pressure if directed by your health care provider.  Check your pulse if directed by your health care provider.  Lose weight if directed by your health care provider. Weight loss may reduce  symptoms of heart failure in some people.  Stop smoking or chewing tobacco. Nicotine makes your heart work harder by causing your blood vessels to constrict. Do not use nicotine gum or patches before talking to your health care provider.  Keep all follow-up visits as directed by your health care provider. This is important.  Limit alcohol intake to no more than 1 drink per day for nonpregnant women and 2 drinks per day for men. One drink equals 12 ounces of beer, 5 ounces of wine, or 1 ounces of hard liquor. Drinking more than that is harmful to your heart. Tell your health care provider if you drink alcohol several times a week. Talk with your health care provider about whether alcohol is safe for you. If your heart has already been damaged by alcohol or you have severe heart failure, drinking alcohol should be stopped completely.  Stop illicit drug use.  Stay up-to-date with immunizations. It is especially important to prevent respiratory infections through current pneumococcal and influenza immunizations.  Manage other health conditions such as hypertension, diabetes, thyroid disease, or abnormal heart rhythms as directed by your health care provider.  Learn to manage stress.  Plan rest periods when fatigued.  Learn strategies to manage high temperatures. If the weather is extremely hot:  Avoid vigorous physical activity.  Use air conditioning or fans or seek a cooler location.  Avoid caffeine and alcohol.  Wear loose-fitting, lightweight, and light-colored clothing.  Learn strategies to manage cold temperatures. If the weather is extremely cold:  Avoid vigorous physical activity.  Layer clothes.  Wear mittens or gloves, a hat, and a scarf when going outside.  Avoid alcohol.  Obtain ongoing education and support as needed.  Participate in or seek rehabilitation as needed to maintain or improve independence and quality of life. SEEK MEDICAL CARE IF:   You have a rapid  weight gain.  You have increasing shortness of breath that is unusual for you.  You are unable to participate in your usual physical activities.  You tire easily.  You cough more than normal, especially with physical activity.  You have any or more swelling in areas such as your hands, feet, ankles, or abdomen.  You are unable to sleep because it is hard to breathe.  You feel like your heart is beating fast (palpitations).  You become dizzy or light-headed upon standing up. SEEK IMMEDIATE MEDICAL CARE IF:   You have difficulty breathing.  There is a change in mental status such as decreased alertness or difficulty with concentration.  You have a pain or discomfort in your chest.  You have an episode of fainting (syncope). MAKE SURE YOU:   Understand these instructions.  Will watch your condition.  Will get help right away if you are not doing well or get worse.   This information is not intended to replace advice given to you by your health care provider. Make sure you discuss any questions you have with your health care provider.   Document Released: 01/03/2005 Document Revised: 05/20/2014 Document Reviewed: 02/03/2012 Elsevier Interactive Patient Education 2016 [employer]  Furosemide tablets What is this medicine? FUROSEMIDE (fyoor [place] se mide) is a diuretic. It  helps you make more urine and to lose salt and excess water from your body. This medicine is used to treat high blood pressure, and edema or swelling from heart, kidney, or liver disease. This medicine may be used for other purposes; ask your health care provider or pharmacist if you have questions. What should I tell my health care provider before I take this medicine? They need to know if you have any of these conditions: -abnormal blood electrolytes -diarrhea or vomiting -gout -heart disease -kidney disease, small amounts of urine, or difficulty passing urine -liver disease -thyroid disease -an  unusual or allergic reaction to furosemide, sulfa drugs, other medicines, foods, dyes, or preservatives -pregnant or trying to get pregnant -breast-feeding How should I use this medicine? Take this medicine by mouth with a glass of water. Follow the directions on the prescription label. You may take this medicine with or without food. If it upsets your stomach, take it with food or milk. Do not take your medicine more often than directed. Remember that you will need to pass more urine after taking this medicine. Do not take your medicine at a time of day that will cause you problems. Do not take at bedtime. Talk to your pediatrician regarding the use of this medicine in children. While this drug may be prescribed for selected conditions, precautions do apply. Overdosage: If you think you have taken too much of this medicine contact a poison control center or emergency room at once. NOTE: This medicine is only for you. Do not share this medicine with others. What if I miss a dose? If you miss a dose, take it as soon as you can. If it is almost time for your next dose, take only that dose. Do not take double or extra doses. What may interact with this medicine? -aspirin and aspirin-like medicines -certain antibiotics -chloral hydrate -cisplatin -cyclosporine -digoxin -diuretics -laxatives -lithium -medicines for blood pressure -medicines that relax muscles for surgery -methotrexate -NSAIDs, medicines for pain and inflammation like ibuprofen, naproxen, or indomethacin -phenytoin -steroid medicines like prednisone or cortisone -sucralfate -thyroid hormones This list may not describe all possible interactions. Give your health care provider a list of all the medicines, herbs, non-prescription drugs, or dietary supplements you use. Also tell them if you smoke, drink alcohol, or use illegal drugs. Some items may interact with your medicine. What should I watch for while using this  medicine? Visit your doctor or health care professional for regular checks on your progress. Check your blood pressure regularly. Ask your doctor or health care professional what your blood pressure should be, and when you should contact him or her. If you are a diabetic, check your blood sugar as directed. You may need to be on a special diet while taking this medicine. Check with your doctor. Also, ask how many glasses of fluid you need to drink a day. You must not get dehydrated. You may get drowsy or dizzy. Do not drive, use machinery, or do anything that needs mental alertness until you know how this drug affects you. Do not stand or sit up quickly, especially if you are an older patient. This reduces the risk of dizzy or fainting spells. Alcohol can make you more drowsy and dizzy. Avoid alcoholic drinks. This medicine can make you more sensitive to the sun. Keep out of the sun. If you cannot avoid being in the sun, wear protective clothing and use sunscreen. Do not use sun lamps or tanning beds/booths. What side effects may  I notice from receiving this medicine? Side effects that you should report to your doctor or health care professional as soon as possible: -blood in urine or stools -dry mouth -fever or chills -hearing loss or ringing in the ears -irregular heartbeat -muscle pain or weakness, cramps -skin rash -stomach upset, pain, or nausea -tingling or numbness in the hands or feet -unusually weak or tired -vomiting or diarrhea -yellowing of the eyes or skin Side effects that usually do not require medical attention (report to your doctor or health care professional if they continue or are bothersome): -headache -loss of appetite -unusual bleeding or bruising This list may not describe all possible side effects. Call your doctor for medical advice about side effects. You may report side effects to FDA at 1-800-FDA-1088. Where should I keep my medicine? Keep out of the reach of  children. Store at room temperature between 15 and 30 degrees C (59 and 86 degrees F). Protect from light. Throw away any unused medicine after the expiration date. NOTE: This sheet is a summary. It may not cover all possible information. If you have questions about this medicine, talk to your doctor, pharmacist, or health care provider.    2016, [employer]. (2014-03-26 13:49:50)

## 2015-02-21 NOTE — ED Notes (Signed)
Pt verbalizes understanding of d/c instructions and denies any further needs at this time. 

## 2015-02-21 NOTE — ED Notes (Signed)
Pt c/o SOB x 1 week.

## 2015-02-21 NOTE — ED Notes (Signed)
Pt c/o SOB for the last few hours.  He states he cannot get a deep breath.  Denies chest pain, lung sounds are clear and pt is speaking in full sentences and ambulating without difficulty.

## 2015-02-21 NOTE — ED Provider Notes (Signed)
CSN: [id number]     Arrival date & time 02/21/15  0022 History   First MD Initiated Contact with Patient 02/21/15 0120     Chief Complaint  Patient presents with  . Shortness of Breath     (Consider location/radiation/quality/duration/timing/severity/associated sxs/prior Treatment) Patient is a 75 y.o. male presenting with shortness of breath. The history is provided by the patient.  Shortness of Breath He had sudden onset of dyspnea tonight at about 10 PM. Dyspnea is worse when he lays down but not when he is walking. There is a vague tight feeling over the lower sternal area. He has been having a chronic cough for the last several weeks which is unchanged. He has had some post tussive emesis. He denies any actual nausea. He denies fever, chills, sweats. He denies arthralgias or myalgias. He has been treated for this cough with a round of azithromycin and a round of doxycycline without any improvement. He has been using his inhaler at home without any improvement.  Past Medical History  Diagnosis Date  . Hypertension   . Hyperlipidemia   . Cancer ([place])   . Skin cancer   . Arthritis   . Osteoarthritis   . Diverticulosis   . Renal disorder   . Kidney stones    Past Surgical History  Procedure Laterality Date  . Skin biopsy    . Hernia repair    . Prostate ablation     History reviewed. No pertinent family history. Social History  Substance Use Topics  . Smoking status: Former Research scientist (life sciences)  . Smokeless tobacco: None  . Alcohol Use: No    Review of Systems  Respiratory: Positive for shortness of breath.   All other systems reviewed and are negative.     Allergies  Pneumococcal vaccines; Sulfa antibiotics; and Aspirin  Home Medications   Prior to Admission medications   Medication Sig Start Date End Date Taking? Authorizing Provider  albuterol (PROVENTIL HFA;VENTOLIN HFA) 108 (90 BASE) MCG/ACT inhaler Inhale 2 puffs into the lungs every 4 (four) hours as needed for wheezing  or shortness of breath.    Historical Provider, MD  amLODipine (NORVASC) 5 MG tablet Take 5 mg by mouth daily.    Historical Provider, MD  atorvastatin (LIPITOR) 40 MG tablet Take 40 mg by mouth daily.    Historical Provider, MD  buPROPion (WELLBUTRIN) 100 MG tablet Take 30 mg by mouth once.    Historical Provider, MD  carvedilol (COREG) 3.125 MG tablet Take 3.125 mg by mouth 2 (two) times daily with a meal.    Historical Provider, MD  cyclobenzaprine (FLEXERIL) 5 MG tablet Take 1 tablet (5 mg total) by mouth 3 (three) times daily as needed for muscle spasms. 01/10/15   Quintella Reichert, MD  Dextromethorphan-Guaifenesin [facility] DM MAXIMUM STRENGTH) 60-1200 MG TB12 Take 1 tablet by mouth 2 (two) times daily as needed (for cough). 02/08/15   John Molpus, MD  doxycycline (VIBRAMYCIN) 100 MG capsule Take 1 capsule (100 mg total) by mouth 2 (two) times daily. One po bid x 7 days 02/08/15   Shanon Rosser, MD  lubiprostone (AMITIZA) 8 MCG capsule Take 5 mcg by mouth 2 (two) times daily with a meal.    Historical Provider, MD  nitroGLYCERIN (NITROSTAT) 0.4 MG SL tablet Place 0.4 mg under the tongue every 5 (five) minutes as needed for chest pain.    Historical Provider, MD  omeprazole (PRILOSEC) 40 MG capsule Take 40 mg by mouth daily.    Historical Provider, MD  ondansetron (ZOFRAN ODT) 8 MG disintegrating tablet Take 1 tablet (8 mg total) by mouth every 8 (eight) hours as needed for nausea or vomiting. 02/06/15   Kandra Nicolas, MD  tamsulosin (FLOMAX) 0.4 MG CAPS capsule Take 0.4 mg by mouth daily.    Historical Provider, MD   BP 142/91 mmHg  Pulse 94  Temp(Src) 97.8 F (36.6 C) (Oral)  Resp 24  Ht 6\' 1"  (1.854 m)  Wt 185 lb (83.915 kg)  BMI 24.41 kg/m2  SpO2 100% Physical Exam  Nursing note and vitals reviewed.  75 year old male, resting comfortably and in no acute distress. Vital signs are significant for tachypnea and mild hypertension. Oxygen saturation is 100%, which is normal. Head is  normocephalic and atraumatic. PERRLA, EOMI. Oropharynx is clear. Neck is nontender and supple without adenopathy or JVD. Back is nontender and there is no CVA tenderness. Lungs are clear without rales, wheezes, or rhonchi. Chest is nontender. Heart has regular rate and rhythm without murmur. Abdomen is soft, flat, nontender without masses or hepatosplenomegaly and peristalsis is normoactive. Extremities have no cyanosis or edema, full range of motion is present. Skin is warm and dry without rash. Neurologic: Mental status is normal, cranial nerves are intact, there are no motor or sensory deficits.  ED Course  Procedures (including critical care time) Labs Review Results for orders placed or performed during the hospital encounter of 02/21/15  CBC with Differential  Result Value Ref Range   WBC 8.6 4.0 - 10.5 K/uL   RBC 3.83 (L) 4.22 - 5.81 MIL/uL   Hemoglobin 12.0 (L) 13.0 - 17.0 g/dL   HCT 36.5 (L) 39.0 - 52.0 %   MCV 95.3 78.0 - 100.0 fL   MCH 31.3 26.0 - 34.0 pg   MCHC 32.9 30.0 - 36.0 g/dL   RDW 13.7 11.5 - 15.5 %   Platelets 295 150 - 400 K/uL   Neutrophils Relative % 63 %   Neutro Abs 5.4 1.7 - 7.7 K/uL   Lymphocytes Relative 26 %   Lymphs Abs 2.2 0.7 - 4.0 K/uL   Monocytes Relative 10 %   Monocytes Absolute 0.9 0.1 - 1.0 K/uL   Eosinophils Relative 1 %   Eosinophils Absolute 0.1 0.0 - 0.7 K/uL   Basophils Relative 0 %   Basophils Absolute 0.0 0.0 - 0.1 K/uL  Comprehensive metabolic panel  Result Value Ref Range   Sodium 140 135 - 145 mmol/L   Potassium 3.8 3.5 - 5.1 mmol/L   Chloride 108 101 - 111 mmol/L   CO2 25 22 - 32 mmol/L   Glucose, Bld 106 (H) 65 - 99 mg/dL   BUN 10 6 - 20 mg/dL   Creatinine, Ser 0.91 0.61 - 1.24 mg/dL   Calcium 8.6 (L) 8.9 - 10.3 mg/dL   Total Protein 6.8 6.5 - 8.1 g/dL   Albumin 3.8 3.5 - 5.0 g/dL   AST 29 15 - 41 U/L   ALT 16 (L) 17 - 63 U/L   Alkaline Phosphatase 55 38 - 126 U/L   Total Bilirubin 0.8 0.3 - 1.2 mg/dL   GFR calc  non Af Amer >60 >60 mL/min   GFR calc Af Amer >60 >60 mL/min   Anion gap 7 5 - 15  Troponin I  Result Value Ref Range   Troponin I <0.03 <0.031 ng/mL  Brain natriuretic peptide  Result Value Ref Range   B Natriuretic Peptide 457.9 (H) 0.0 - 100.0 pg/mL    Imaging Review  Dg Chest 2 View  02/21/2015  CLINICAL DATA:  Shortness of breath for 2 hours. EXAM: CHEST  2 VIEW COMPARISON:  CT 3 days prior 02/17/2015.  Radiographs 02/08/2015 FINDINGS: Cardiomegaly and mediastinal contours are unchanged. Subpleural linear opacities at the lung bases consistent with Kerley B-lines. Mild interstitial prominence from prior. No consolidation, pleural effusion, or pneumothorax. No acute osseous abnormalities are seen. IMPRESSION: Suspect mild pulmonary edema.  The exam is otherwise unchanged. Electronically Signed   By: Jeb Levering M.D.   On: 02/21/2015 00:49   I have personally reviewed and evaluated these images and lab results as part of my medical decision-making.   EKG Interpretation   Date/Time:  Saturday February 21 2015 00:32:20 EST Ventricular Rate:  96 PR Interval:  192 QRS Duration: 104 QT Interval:  372 QTC Calculation: 470 R Axis:   -28 Text Interpretation:  Sinus rhythm Probable left atrial enlargement Left  ventricular hypertrophy Anterior Q waves, possibly due to LVH Abnormal T,  consider ischemia, lateral leads When compared with ECG of 01/10/2015, No  significant change was found Confirmed by [facility]  MD, Wendie Diskin ([id number]) on  02/21/2015 12:47:59 AM      MDM   Final diagnoses:  Congestive heart failure, unspecified congestive heart failure chronicity, unspecified congestive heart failure type (HCC)  Normochromic normocytic anemia    Acute dyspnea. Chest x-ray appears to show mild pulmonary edema but this is not evident on clinical examination has no peripheral edema. No evidence of bronchospasm on exam. Old records are reviewed and he was seen in ED on January 2 for cough which  was associated with bronchospasm. He was treated with doxycycline and guaifenesin with dextromethorphan. Review of care everywhere shows that he had a CT scan of the chest on February 1 to evaluate pulmonary nodule and this was unremarkable.  Workup is significant for elevated BNP consistent with heart failure. He was given a dose of furosemide and had good diuresis and felt much better. Hospital admission was recommended based on the new onset heart failure. Patient is refusing admission. It was explained to him that this is the standard of care. He acknowledges this and states that if he starts feeling worse, he will return. He is referred to cardiology for follow-up and sent home with prescription for furosemide.  Delora Fuel, MD [id number]

## 2015-05-31 ENCOUNTER — Encounter (HOSPITAL_BASED_OUTPATIENT_CLINIC_OR_DEPARTMENT_OTHER): Payer: Self-pay | Admitting: Emergency Medicine

## 2015-05-31 ENCOUNTER — Emergency Department (HOSPITAL_BASED_OUTPATIENT_CLINIC_OR_DEPARTMENT_OTHER): Payer: Medicare Other

## 2015-05-31 ENCOUNTER — Emergency Department (HOSPITAL_BASED_OUTPATIENT_CLINIC_OR_DEPARTMENT_OTHER)
Admission: EM | Admit: 2015-05-31 | Discharge: 2015-05-31 | Disposition: A | Discharge status: Home / Self Care | Payer: Medicare Other | Attending: Emergency Medicine | Admitting: Emergency Medicine

## 2015-05-31 DIAGNOSIS — N390 Urinary tract infection, site not specified: Secondary | ICD-10-CM | POA: Diagnosis not present

## 2015-05-31 DIAGNOSIS — Z87891 Personal history of nicotine dependence: Secondary | ICD-10-CM | POA: Diagnosis not present

## 2015-05-31 DIAGNOSIS — I10 Essential (primary) hypertension: Secondary | ICD-10-CM | POA: Diagnosis not present

## 2015-05-31 DIAGNOSIS — E785 Hyperlipidemia, unspecified: Secondary | ICD-10-CM | POA: Insufficient documentation

## 2015-05-31 DIAGNOSIS — M1990 Unspecified osteoarthritis, unspecified site: Secondary | ICD-10-CM | POA: Insufficient documentation

## 2015-05-31 DIAGNOSIS — Z85828 Personal history of other malignant neoplasm of skin: Secondary | ICD-10-CM | POA: Insufficient documentation

## 2015-05-31 DIAGNOSIS — Z79899 Other long term (current) drug therapy: Secondary | ICD-10-CM | POA: Diagnosis not present

## 2015-05-31 DIAGNOSIS — R319 Hematuria, unspecified: Secondary | ICD-10-CM

## 2015-05-31 DIAGNOSIS — R1031 Right lower quadrant pain: Secondary | ICD-10-CM | POA: Diagnosis present

## 2015-05-31 LAB — CBC WITH DIFFERENTIAL/PLATELET
BASOS PCT: 1 %
Basophils Absolute: 0 10*3/uL (ref 0.0–0.1)
EOS ABS: 0.1 10*3/uL (ref 0.0–0.7)
EOS PCT: 1 %
HCT: 42.4 % (ref 39.0–52.0)
HEMOGLOBIN: 14.5 g/dL (ref 13.0–17.0)
Lymphocytes Relative: 32 %
Lymphs Abs: 1.9 10*3/uL (ref 0.7–4.0)
MCH: 32.6 pg (ref 26.0–34.0)
MCHC: 34.2 g/dL (ref 30.0–36.0)
MCV: 95.3 fL (ref 78.0–100.0)
Monocytes Absolute: 0.7 10*3/uL (ref 0.1–1.0)
Monocytes Relative: 11 %
NEUTROS PCT: 55 %
Neutro Abs: 3.3 10*3/uL (ref 1.7–7.7)
PLATELETS: 236 10*3/uL (ref 150–400)
RBC: 4.45 MIL/uL (ref 4.22–5.81)
RDW: 12.7 % (ref 11.5–15.5)
WBC: 5.9 10*3/uL (ref 4.0–10.5)

## 2015-05-31 LAB — BASIC METABOLIC PANEL
Anion gap: 9 (ref 5–15)
BUN: 18 mg/dL (ref 6–20)
CALCIUM: 9.2 mg/dL (ref 8.9–10.3)
CHLORIDE: 102 mmol/L (ref 101–111)
CO2: 27 mmol/L (ref 22–32)
CREATININE: 1.15 mg/dL (ref 0.61–1.24)
GFR calc Af Amer: >60 mL/min (ref >60)
GLUCOSE: 90 mg/dL (ref 65–99)
POTASSIUM: 3.8 mmol/L (ref 3.5–5.1)
SODIUM: 138 mmol/L (ref 135–145)

## 2015-05-31 LAB — URINE MICROSCOPIC-ADD ON

## 2015-05-31 LAB — URINALYSIS, ROUTINE W REFLEX MICROSCOPIC
Glucose, UA: NEGATIVE mg/dL
KETONES UR: NEGATIVE mg/dL
NITRITE: POSITIVE — AB
PROTEIN: 30 mg/dL — AB
Specific Gravity, Urine: 1.024 (ref 1.005–1.030)
pH: 6 (ref 5.0–8.0)

## 2015-05-31 MED ORDER — MORPHINE SULFATE (PF) 4 MG/ML IV SOLN
4.0000 mg | Freq: Once | INTRAVENOUS | Status: AC
  Administered 2015-05-31: 4 mg via INTRAVENOUS
  Filled 2015-05-31: qty 1

## 2015-05-31 MED ORDER — SODIUM CHLORIDE 0.9 % IV BOLUS (SEPSIS)
1000.0000 mL | Freq: Once | INTRAVENOUS | Status: AC
  Administered 2015-05-31: 1000 mL via INTRAVENOUS

## 2015-05-31 MED ORDER — ONDANSETRON HCL 4 MG/2ML IJ SOLN
4.0000 mg | Freq: Once | INTRAMUSCULAR | Status: AC
  Administered 2015-05-31: 4 mg via INTRAVENOUS
  Filled 2015-05-31: qty 2

## 2015-05-31 MED ORDER — CEPHALEXIN 500 MG PO CAPS: 500.0000 mg | ORAL_CAPSULE | Freq: Four times a day (QID) | ORAL | Status: DC

## 2015-05-31 MED ORDER — DEXTROSE 5 % IV SOLN
1.0000 g | Freq: Once | INTRAVENOUS | Status: AC
  Administered 2015-05-31: 1 g via INTRAVENOUS
  Filled 2015-05-31: qty 10

## 2015-05-31 MED ORDER — HYDROCODONE-ACETAMINOPHEN 5-325 MG PO TABS: 1.0000 | ORAL_TABLET | ORAL | Status: DC | PRN

## 2015-05-31 NOTE — ED Provider Notes (Signed)
CSN: [id number]     Arrival date & time 05/31/15  1102 History   First MD Initiated Contact with Patient 05/31/15 1125     Chief Complaint  Patient presents with  . Abdominal Pain  . Flank Pain     (Consider location/radiation/quality/duration/timing/severity/associated sxs/prior Treatment) HPI Comments: Patient reports right-sided flank and abdominal pain since 10 PM last night that comes and goes in waves. Associated with blood in his urine this morning. Similar to when he had UTIs in the past. Also has history of kidney stones. Denies testicular pain. Denies any fever. Denies any diarrhea. Still has appendix and gallbladder. Has had multiple procedures for kidney stone removal. No chest pain or shortness of breath. No focal weakness, numbness or tingling. States he also has a history of prostate problems  Patient is a 75 y.o. male presenting with flank pain. The history is provided by the patient.  Flank Pain Associated symptoms include abdominal pain. Pertinent negatives include no chest pain and no shortness of breath.    Past Medical History  Diagnosis Date  . Hypertension   . Hyperlipidemia   . Cancer ([place])   . Skin cancer   . Arthritis   . Osteoarthritis   . Diverticulosis   . Renal disorder   . Kidney stones    Past Surgical History  Procedure Laterality Date  . Skin biopsy    . Hernia repair    . Prostate ablation     No family history on file. Social History  Substance Use Topics  . Smoking status: Former Research scientist (life sciences)  . Smokeless tobacco: None  . Alcohol Use: No    Review of Systems  Constitutional: Negative for fever, activity change and appetite change.  HENT: Negative for congestion and rhinorrhea.   Respiratory: Negative for cough, chest tightness and shortness of breath.   Cardiovascular: Negative for chest pain.  Gastrointestinal: Positive for abdominal pain. Negative for nausea and vomiting.  Genitourinary: Positive for dysuria, hematuria and flank pain.  Negative for scrotal swelling and testicular pain.  Musculoskeletal: Positive for back pain. Negative for myalgias and arthralgias.  Skin: Negative for rash.  Neurological: Negative for dizziness, weakness, light-headedness and numbness.  A complete 10 system review of systems was obtained and all systems are negative except as noted in the HPI and PMH.      Allergies  Pneumococcal vaccines; Sulfa antibiotics; and Aspirin  Home Medications   Prior to Admission medications   Medication Sig Start Date End Date Taking? Authorizing Provider  albuterol (PROVENTIL HFA;VENTOLIN HFA) 108 (90 BASE) MCG/ACT inhaler Inhale 2 puffs into the lungs every 4 (four) hours as needed for wheezing or shortness of breath.    Historical Provider, MD  amLODipine (NORVASC) 5 MG tablet Take 5 mg by mouth daily.    Historical Provider, MD  atorvastatin (LIPITOR) 40 MG tablet Take 40 mg by mouth daily.    Historical Provider, MD  buPROPion (WELLBUTRIN) 100 MG tablet Take 30 mg by mouth once.    Historical Provider, MD  carvedilol (COREG) 3.125 MG tablet Take 3.125 mg by mouth 2 (two) times daily with a meal.    Historical Provider, MD  cephALEXin (KEFLEX) 500 MG capsule Take 1 capsule (500 mg total) by mouth 4 (four) times daily. 05/31/15   Ezequiel Essex, MD  cyclobenzaprine (FLEXERIL) 5 MG tablet Take 1 tablet (5 mg total) by mouth 3 (three) times daily as needed for muscle spasms. 01/10/15   Quintella Reichert, MD  Dextromethorphan-Guaifenesin [facility] DM MAXIMUM  STRENGTH) 60-1200 MG TB12 Take 1 tablet by mouth 2 (two) times daily as needed (for cough). 02/08/15   John Molpus, MD  doxycycline (VIBRAMYCIN) 100 MG capsule Take 1 capsule (100 mg total) by mouth 2 (two) times daily. One po bid x 7 days 02/08/15   Shanon Rosser, MD  furosemide (LASIX) 40 MG tablet Take 1 tablet (40 mg total) by mouth daily. [id number]   Delora Fuel, MD  HYDROcodone-acetaminophen (NORCO/VICODIN) 5-325 MG tablet Take 1 tablet by mouth every 4 (four)  hours as needed. 05/31/15   Ezequiel Essex, MD  lubiprostone (AMITIZA) 8 MCG capsule Take 5 mcg by mouth 2 (two) times daily with a meal.    Historical Provider, MD  nitroGLYCERIN (NITROSTAT) 0.4 MG SL tablet Place 0.4 mg under the tongue every 5 (five) minutes as needed for chest pain.    Historical Provider, MD  omeprazole (PRILOSEC) 40 MG capsule Take 40 mg by mouth daily.    Historical Provider, MD  ondansetron (ZOFRAN ODT) 8 MG disintegrating tablet Take 1 tablet (8 mg total) by mouth every 8 (eight) hours as needed for nausea or vomiting. 02/06/15   Kandra Nicolas, MD  tamsulosin (FLOMAX) 0.4 MG CAPS capsule Take 0.4 mg by mouth daily.    Historical Provider, MD   BP 154/110 mmHg  Pulse 86  Temp(Src) 97.6 F (36.4 C) (Oral)  Resp 18  Ht 6\' 1"  (1.854 m)  Wt 187 lb (84.823 kg)  BMI 24.68 kg/m2  SpO2 99% Physical Exam  Constitutional: He is oriented to person, place, and time. He appears well-developed and well-nourished. No distress.  HENT:  Head: Normocephalic and atraumatic.  Mouth/Throat: Oropharynx is clear and moist. No oropharyngeal exudate.  Eyes: Conjunctivae and EOM are normal. Pupils are equal, round, and reactive to light.  Neck: Normal range of motion. Neck supple.  No meningismus.  Cardiovascular: Normal rate, regular rhythm, normal heart sounds and intact distal pulses.   No murmur heard. Pulmonary/Chest: Effort normal and breath sounds normal. No respiratory distress.  Abdominal: Soft. There is tenderness. There is no rebound and no guarding.  RLQ and suprapubic tenderness with guarding  Genitourinary:  No testicular tenderness  Musculoskeletal: Normal range of motion. He exhibits tenderness. He exhibits no edema.  R CVAT  Neurological: He is alert and oriented to person, place, and time. No cranial nerve deficit. He exhibits normal muscle tone. Coordination normal.  No ataxia on finger to nose bilaterally. No pronator drift. 5/5 strength throughout. CN 2-12  intact.Equal grip strength. Sensation intact.   Skin: Skin is warm.  Psychiatric: He has a normal mood and affect. His behavior is normal.  Nursing note and vitals reviewed.   ED Course  Procedures (including critical care time) Labs Review Labs Reviewed  URINALYSIS, ROUTINE W REFLEX MICROSCOPIC (NOT AT [facility]) - Abnormal; Notable for the following:    Color, Urine AMBER (*)    APPearance CLOUDY (*)    Hgb urine dipstick LARGE (*)    Bilirubin Urine SMALL (*)    Protein, ur 30 (*)    Nitrite POSITIVE (*)    Leukocytes, UA SMALL (*)    All other components within normal limits  URINE MICROSCOPIC-ADD ON - Abnormal; Notable for the following:    Squamous Epithelial / LPF 6-30 (*)    Bacteria, UA FEW (*)    All other components within normal limits  URINE CULTURE  CBC WITH DIFFERENTIAL/PLATELET  BASIC METABOLIC PANEL    Imaging Review Ct Renal Stone Study  05/31/2015  CLINICAL DATA:  75 year old male with history of hematuria and right-sided flank pain since yesterday evening. History of kidney stones. EXAM: CT ABDOMEN AND PELVIS WITHOUT CONTRAST TECHNIQUE: Multidetector CT imaging of the abdomen and pelvis was performed following the standard protocol without IV contrast. COMPARISON:  CT the abdomen and pelvis 01/24/2015. FINDINGS: Lower chest: Mild scarring in the medial segment of the right middle lobe. Mild cardiomegaly. Atherosclerotic calcifications in the right coronary artery. Hepatobiliary: A few tiny sub cm low-attenuation lesions scattered throughout the liver appear similar in size, number and distribution to prior examination, favored to represent tiny cysts, but incompletely characterized on today's noncontrast CT examination. Unenhanced appearance of the gallbladder is normal. Pancreas: No pancreatic mass or peripancreatic inflammatory changes identified on today's noncontrast CT examination. Spleen: Unremarkable. Adrenals/Urinary Tract: 2 tiny 1-2 mm nonobstructive calculi in  the upper pole collecting system of the right kidney. No additional calculi are noted within the left renal collecting system, along the course of either ureter or within the lumen of the urinary bladder. No hydroureteronephrosis or perinephric stranding to indicate urinary tract obstruction at this time. Unenhanced appearance of the kidneys is otherwise unremarkable. Bilateral adrenal glands are normal in appearance. 3.1 x 1.8 cm diverticulum from the posterolateral aspect of the urinary bladder on the left side just lateral to the left ureterovesicular junction, similar to the prior study. Stomach/Bowel: The appearance of the stomach is normal. There is no pathologic dilatation of small bowel or colon. Normal appendix. Vascular/Lymphatic: Extensive atherosclerosis throughout the abdominal and pelvic vasculature, without evidence of aneurysm or dissection. No lymphadenopathy noted in the abdomen or pelvis. Reproductive: Defect in the superior aspect of the prostate gland, compatible with prior TURP seminal vesicles are unremarkable in appearance. Other: No significant volume of ascites.  No pneumoperitoneum. Musculoskeletal: There are no aggressive appearing lytic or blastic lesions noted in the visualized portions of the skeleton. IMPRESSION: 1. Two tiny 1-2 mm nonobstructive calculi are present within the upper pole collecting system of the right kidney. No ureteral stones or findings of urinary tract obstruction are noted at this time. 2. 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 3. Atherosclerosis, including right coronary artery disease. Assessment for potential risk factor modification, dietary therapy or pharmacologic therapy may be warranted, if clinically indicated. 4. Normal appendix. 5. Additional incidental findings, as above. Electronically Signed   By: Vinnie Langton M.D.   On: 05/31/2015 13:31   I have personally reviewed and evaluated these images and lab  results as part of my medical decision-making.   EKG Interpretation None      MDM   Final diagnoses:  Urinary tract infection with hematuria, site unspecified   Right flank and lower abdominal pain with hematuria since last night. No fever or vomiting. No chest pain or SOB.  Labs at baseline.  urinalysis consistent with infection. Culture sent and Rocephin given. CT shows tiny stones within the right kidney but no ureteral stones and no obstruction. Appendix normal.  Patient urinating without difficulty. He is tolerating by mouth and his pain has resolved.   We'll treat for UTI. Follow up with his PCP and urologist this week.  Return precautions Discussed.    Ezequiel Essex, MD 05/31/15 [phone]

## 2015-05-31 NOTE — ED Notes (Signed)
Pt states LRQ and right flank pain since last night, along with small amount of blood noted in urine.

## 2015-05-31 NOTE — Discharge Instructions (Signed)
Urinary Tract Infection Follow up with your urologist. Take the antibiotics as prescribed. Return to the ED if you develop new or worsening symptoms. Urinary tract infections (UTIs) can develop anywhere along your urinary tract. Your urinary tract is your body's drainage system for removing wastes and extra water. Your urinary tract includes two kidneys, two ureters, a bladder, and a urethra. Your kidneys are a pair of bean-shaped organs. Each kidney is about the size of your fist. They are located below your ribs, one on each side of your spine. CAUSES Infections are caused by microbes, which are microscopic organisms, including fungi, viruses, and bacteria. These organisms are so small that they can only be seen through a microscope. Bacteria are the microbes that most commonly cause UTIs. SYMPTOMS  Symptoms of UTIs may vary by age and gender of the patient and by the location of the infection. Symptoms in young women typically include a frequent and intense urge to urinate and a painful, burning feeling in the bladder or urethra during urination. Older women and men are more likely to be tired, shaky, and weak and have muscle aches and abdominal pain. A fever may mean the infection is in your kidneys. Other symptoms of a kidney infection include pain in your back or sides below the ribs, nausea, and vomiting. DIAGNOSIS To diagnose a UTI, your caregiver will ask you about your symptoms. Your caregiver will also ask you to provide a urine sample. The urine sample will be tested for bacteria and white blood cells. White blood cells are made by your body to help fight infection. TREATMENT  Typically, UTIs can be treated with medication. Because most UTIs are caused by a bacterial infection, they usually can be treated with the use of antibiotics. The choice of antibiotic and length of treatment depend on your symptoms and the type of bacteria causing your infection. HOME CARE INSTRUCTIONS  If you were  prescribed antibiotics, take them exactly as your caregiver instructs you. Finish the medication even if you feel better after you have only taken some of the medication.  Drink enough water and fluids to keep your urine clear or pale yellow.  Avoid caffeine, tea, and carbonated beverages. They tend to irritate your bladder.  Empty your bladder often. Avoid holding urine for long periods of time.  Empty your bladder before and after sexual intercourse.  After a bowel movement, women should cleanse from front to back. Use each tissue only once. SEEK MEDICAL CARE IF:   You have back pain.  You develop a fever.  Your symptoms do not begin to resolve within 3 days. SEEK IMMEDIATE MEDICAL CARE IF:   You have severe back pain or lower abdominal pain.  You develop chills.  You have nausea or vomiting.  You have continued burning or discomfort with urination. MAKE SURE YOU:   Understand these instructions.  Will watch your condition.  Will get help right away if you are not doing well or get worse.   This information is not intended to replace advice given to you by your health care provider. Make sure you discuss any questions you have with your health care provider.   Document Released: 10/13/2004 Document Revised: 09/24/2014 Document Reviewed: 02/11/2011 Elsevier Interactive Patient Education [employer].

## 2015-06-03 LAB — URINE CULTURE

## 2015-06-04 NOTE — Progress Notes (Signed)
ED Antimicrobial Stewardship Positive Culture Follow Up   MARZ JANICE is an 75 y.o. male who presented to [facility] on 05/31/2015 with a chief complaint of  Chief Complaint  Patient presents with  . Abdominal Pain  . Flank Pain    Recent Results (from the past 720 hour(s))  Urine culture     Status: Abnormal   Collection Time: 05/31/15 11:20 AM  Result Value Ref Range Status   Specimen Description URINE, CLEAN CATCH  Final   Special Requests NONE  Final   Culture (A)  Final    >=100,000 COLONIES/mL STAPHYLOCOCCUS SPECIES (COAGULASE NEGATIVE) 30,000 COLONIES/mL ESCHERICHIA COLI    Report Status 06/03/2015 FINAL  Final   Organism ID, Bacteria STAPHYLOCOCCUS SPECIES (COAGULASE NEGATIVE) (A)  Final   Organism ID, Bacteria ESCHERICHIA COLI (A)  Final      Susceptibility   Escherichia coli - MIC*    AMPICILLIN 4 SENSITIVE Sensitive     CEFAZOLIN <=4 SENSITIVE Sensitive     CEFTRIAXONE <=1 SENSITIVE Sensitive     CIPROFLOXACIN <=0.25 SENSITIVE Sensitive     GENTAMICIN <=1 SENSITIVE Sensitive     IMIPENEM <=0.25 SENSITIVE Sensitive     NITROFURANTOIN <=16 SENSITIVE Sensitive     TRIMETH/SULFA <=20 SENSITIVE Sensitive     AMPICILLIN/SULBACTAM 4 SENSITIVE Sensitive     PIP/TAZO <=4 SENSITIVE Sensitive     * 30,000 COLONIES/mL ESCHERICHIA COLI   Staphylococcus species (coagulase negative) - MIC*    CIPROFLOXACIN >=8 RESISTANT Resistant     GENTAMICIN <=0.5 SENSITIVE Sensitive     NITROFURANTOIN <=16 SENSITIVE Sensitive     OXACILLIN >=4 RESISTANT Resistant     TETRACYCLINE 2 SENSITIVE Sensitive     VANCOMYCIN 1 SENSITIVE Sensitive     TRIMETH/SULFA <=10 SENSITIVE Sensitive     CLINDAMYCIN <=0.25 SENSITIVE Sensitive     RIFAMPIN <=0.5 SENSITIVE Sensitive     Inducible Clindamycin NEGATIVE Sensitive     * >=100,000 COLONIES/mL STAPHYLOCOCCUS SPECIES (COAGULASE NEGATIVE)    [x]  Treated with Keflex, organism resistant to prescribed antimicrobial  The patient was noted to have  tiny stones w/in the R-kidney but no ureteral stones or obstruction. UA was very contaminated and may not be indicative of true infection.  New antibiotic prescription: D/c Keflex and call for symptom check - If asymptomatic - no further treatment needed - If still symptomatic - start Bactrim DS 1 tab bid x 7 days (sulfa-antibiotic allergy noted and listed as "nausea only" so okay to re-try)  ED Provider: Donnald Garre, PA-C  Lawson Radar 06/04/2015, 9:11 AM Infectious Diseases Pharmacist Phone# [phone]

## 2015-07-25 ENCOUNTER — Encounter (HOSPITAL_BASED_OUTPATIENT_CLINIC_OR_DEPARTMENT_OTHER): Payer: Self-pay | Admitting: Emergency Medicine

## 2015-07-25 ENCOUNTER — Emergency Department (HOSPITAL_BASED_OUTPATIENT_CLINIC_OR_DEPARTMENT_OTHER): Payer: Medicare Other

## 2015-07-25 ENCOUNTER — Emergency Department (HOSPITAL_BASED_OUTPATIENT_CLINIC_OR_DEPARTMENT_OTHER)
Admission: EM | Admit: 2015-07-25 | Discharge: 2015-07-26 | Disposition: A | Discharge status: Home / Self Care | Payer: Medicare Other | Attending: Emergency Medicine | Admitting: Emergency Medicine

## 2015-07-25 DIAGNOSIS — Z85828 Personal history of other malignant neoplasm of skin: Secondary | ICD-10-CM | POA: Insufficient documentation

## 2015-07-25 DIAGNOSIS — I10 Essential (primary) hypertension: Secondary | ICD-10-CM | POA: Insufficient documentation

## 2015-07-25 DIAGNOSIS — E785 Hyperlipidemia, unspecified: Secondary | ICD-10-CM | POA: Diagnosis not present

## 2015-07-25 DIAGNOSIS — Z87891 Personal history of nicotine dependence: Secondary | ICD-10-CM | POA: Diagnosis not present

## 2015-07-25 DIAGNOSIS — R509 Fever, unspecified: Secondary | ICD-10-CM | POA: Insufficient documentation

## 2015-07-25 LAB — URINALYSIS, ROUTINE W REFLEX MICROSCOPIC
BILIRUBIN URINE: NEGATIVE
Glucose, UA: NEGATIVE mg/dL
Ketones, ur: NEGATIVE mg/dL
Leukocytes, UA: NEGATIVE
NITRITE: NEGATIVE
PH: 6.5 (ref 5.0–8.0)
Protein, ur: NEGATIVE mg/dL
SPECIFIC GRAVITY, URINE: 1.025 (ref 1.005–1.030)

## 2015-07-25 LAB — CBC WITH DIFFERENTIAL/PLATELET
Basophils Absolute: 0 10*3/uL (ref 0.0–0.1)
Basophils Relative: 0 %
Eosinophils Absolute: 0.2 10*3/uL (ref 0.0–0.7)
Eosinophils Relative: 2 %
HEMATOCRIT: 35.1 % — AB (ref 39.0–52.0)
HEMOGLOBIN: 12 g/dL — AB (ref 13.0–17.0)
LYMPHS ABS: 1.8 10*3/uL (ref 0.7–4.0)
Lymphocytes Relative: 26 %
MCH: 32.7 pg (ref 26.0–34.0)
MCHC: 34.2 g/dL (ref 30.0–36.0)
MCV: 95.6 fL (ref 78.0–100.0)
MONOS PCT: 14 %
Monocytes Absolute: 0.9 10*3/uL (ref 0.1–1.0)
NEUTROS ABS: 3.8 10*3/uL (ref 1.7–7.7)
NEUTROS PCT: 58 %
Platelets: 221 10*3/uL (ref 150–400)
RBC: 3.67 MIL/uL — AB (ref 4.22–5.81)
RDW: 12.6 % (ref 11.5–15.5)
WBC: 6.7 10*3/uL (ref 4.0–10.5)

## 2015-07-25 LAB — URINE MICROSCOPIC-ADD ON: BACTERIA UA: NONE SEEN

## 2015-07-25 LAB — BASIC METABOLIC PANEL
ANION GAP: 8 (ref 5–15)
BUN: 14 mg/dL (ref 6–20)
CHLORIDE: 104 mmol/L (ref 101–111)
CO2: 27 mmol/L (ref 22–32)
Calcium: 8.4 mg/dL — ABNORMAL LOW (ref 8.9–10.3)
Creatinine, Ser: 1.04 mg/dL (ref 0.61–1.24)
GFR calc non Af Amer: >60 mL/min (ref >60)
GLUCOSE: 105 mg/dL — AB (ref 65–99)
POTASSIUM: 3.3 mmol/L — AB (ref 3.5–5.1)
Sodium: 139 mmol/L (ref 135–145)

## 2015-07-25 MED ORDER — ONDANSETRON HCL 4 MG/2ML IJ SOLN
4.0000 mg | Freq: Once | INTRAMUSCULAR | Status: AC
  Administered 2015-07-25: 4 mg via INTRAVENOUS
  Filled 2015-07-25: qty 2

## 2015-07-25 NOTE — ED Provider Notes (Signed)
CSN: [id number]     Arrival date & time 07/25/15  2237 History   By signing my name below, I, Casey Moreno, attest that this documentation has been prepared under the direction and in the presence of Shanon Rosser, MD. Electronically signed, Casey Moreno, ED Scribe. 07/25/2015. 11:18 PM.   Chief Complaint  Patient presents with  . Fever   The history is provided by the patient. No language interpreter was used.   HPI Comments: [name] is a 75 y.o. male with a PMHx of HTN, HLD, CA, and kidney stones who presents to the Emergency Department complaining of a fever and malaise onset this afternoon. He reports temperature of 100.8 taken earlier; he was not febrile on arrival. He has not taken anything for his fever. He was seen by his PCP for dysuria two days ago and was given Uribel. He denies any urinary difficulties currently. He reports associated nausea, occasional cough, and headache and neck pain status post a head injury about 3 weeks ago. He was evaluated for the injury and reports a negative workup. He denies vomiting, diarrhea and chills.    Past Medical History  Diagnosis Date  . Hypertension   . Hyperlipidemia   . Cancer ([place])   . Skin cancer   . Arthritis   . Osteoarthritis   . Diverticulosis   . Renal disorder   . Kidney stones    Past Surgical History  Procedure Laterality Date  . Skin biopsy    . Hernia repair    . Prostate ablation     History reviewed. No pertinent family history. Social History  Substance Use Topics  . Smoking status: Former Research scientist (life sciences)  . Smokeless tobacco: None  . Alcohol Use: No    Review of Systems  All other systems reviewed and are negative.  Allergies  Pneumococcal vaccines; Sulfa antibiotics; and Aspirin  Home Medications   Prior to Admission medications   Medication Sig Start Date End Date Taking? Authorizing Provider  albuterol (PROVENTIL HFA;VENTOLIN HFA) 108 (90 BASE) MCG/ACT inhaler Inhale 2 puffs into the lungs every 4 (four)  hours as needed for wheezing or shortness of breath.    Historical Provider, MD  amLODipine (NORVASC) 5 MG tablet Take 5 mg by mouth daily.    Historical Provider, MD  atorvastatin (LIPITOR) 40 MG tablet Take 40 mg by mouth daily.    Historical Provider, MD  buPROPion (WELLBUTRIN) 100 MG tablet Take 30 mg by mouth once.    Historical Provider, MD  carvedilol (COREG) 3.125 MG tablet Take 3.125 mg by mouth 2 (two) times daily with a meal.    Historical Provider, MD  cephALEXin (KEFLEX) 500 MG capsule Take 1 capsule (500 mg total) by mouth 4 (four) times daily. 05/31/15   Ezequiel Essex, MD  cyclobenzaprine (FLEXERIL) 5 MG tablet Take 1 tablet (5 mg total) by mouth 3 (three) times daily as needed for muscle spasms. 01/10/15   Quintella Reichert, MD  Dextromethorphan-Guaifenesin [facility] DM MAXIMUM STRENGTH) 60-1200 MG TB12 Take 1 tablet by mouth 2 (two) times daily as needed (for cough). 02/08/15   Jace Dowe, MD  doxycycline (VIBRAMYCIN) 100 MG capsule Take 1 capsule (100 mg total) by mouth 2 (two) times daily. One po bid x 7 days 02/08/15   Shanon Rosser, MD  furosemide (LASIX) 40 MG tablet Take 1 tablet (40 mg total) by mouth daily. [id number]   Delora Fuel, MD  HYDROcodone-acetaminophen (NORCO/VICODIN) 5-325 MG tablet Take 1 tablet by mouth every 4 (four)  hours as needed. 05/31/15   Ezequiel Essex, MD  lubiprostone (AMITIZA) 8 MCG capsule Take 5 mcg by mouth 2 (two) times daily with a meal.    Historical Provider, MD  nitroGLYCERIN (NITROSTAT) 0.4 MG SL tablet Place 0.4 mg under the tongue every 5 (five) minutes as needed for chest pain.    Historical Provider, MD  omeprazole (PRILOSEC) 40 MG capsule Take 40 mg by mouth daily.    Historical Provider, MD  ondansetron (ZOFRAN ODT) 8 MG disintegrating tablet Take 1 tablet (8 mg total) by mouth every 8 (eight) hours as needed for nausea or vomiting. 02/06/15   Kandra Nicolas, MD  tamsulosin (FLOMAX) 0.4 MG CAPS capsule Take 0.4 mg by mouth daily.    Historical  Provider, MD   BP 134/94 mmHg  Pulse 55  Temp(Src) 98.5 F (36.9 C) (Oral)  Resp 18  Ht 6\' 1"  (1.854 m)  Wt 189 lb (85.73 kg)  BMI 24.94 kg/m2  SpO2 98%   General: Well-developed, well-nourished male in no acute distress; appearance consistent with age of record HENT: normocephalic; atraumatic Eyes: pupils equal, round and reactive to light; extraocular muscles intact, lens implants Neck: supple, non-tender Heart: regular rate and rhythm Lungs: clear to auscultation bilaterally, clear to auscultation; coughing on deep breaths Abdomen: soft; nondistended; nontender; no masses or hepatosplenomegaly; bowel sounds present Extremities: No deformity; full range of motion; pulses normal Neurologic: Awake, alert and oriented; motor function intact in all extremities and symmetric; no facial droop Skin: Warm and dry, maculopapular rash on back; dermatosis rhomboidalis nuchae Psychiatric: Normal mood and affect  Physical Exam  ED Course  Procedures    MDM   Nursing notes and vitals signs, including pulse oximetry, reviewed.  Summary of this visit's results, reviewed by myself:  Labs:  Results for orders placed or performed during the hospital encounter of 07/25/15 (from the past 24 hour(s))  Urinalysis, Routine w reflex microscopic (not at [facility])     Status: Abnormal   Collection Time: 07/25/15 10:56 PM  Result Value Ref Range   Color, Urine GREEN (A) YELLOW   APPearance CLEAR CLEAR   Specific Gravity, Urine 1.025 1.005 - 1.030   pH 6.5 5.0 - 8.0   Glucose, UA NEGATIVE NEGATIVE mg/dL   Hgb urine dipstick TRACE (A) NEGATIVE   Bilirubin Urine NEGATIVE NEGATIVE   Ketones, ur NEGATIVE NEGATIVE mg/dL   Protein, ur NEGATIVE NEGATIVE mg/dL   Nitrite NEGATIVE NEGATIVE   Leukocytes, UA NEGATIVE NEGATIVE  Urine microscopic-add on     Status: Abnormal   Collection Time: 07/25/15 10:56 PM  Result Value Ref Range   Squamous Epithelial / LPF 0-5 (A) NONE SEEN   WBC, UA 0-5 0 - 5  WBC/hpf   RBC / HPF 0-5 0 - 5 RBC/hpf   Bacteria, UA NONE SEEN NONE SEEN   Urine-Other MUCOUS PRESENT   CBC with Differential/Platelet     Status: Abnormal   Collection Time: 07/25/15 11:15 PM  Result Value Ref Range   WBC 6.7 4.0 - 10.5 K/uL   RBC 3.67 (L) 4.22 - 5.81 MIL/uL   Hemoglobin 12.0 (L) 13.0 - 17.0 g/dL   HCT 35.1 (L) 39.0 - 52.0 %   MCV 95.6 78.0 - 100.0 fL   MCH 32.7 26.0 - 34.0 pg   MCHC 34.2 30.0 - 36.0 g/dL   RDW 12.6 11.5 - 15.5 %   Platelets 221 150 - 400 K/uL   Neutrophils Relative % 58 %   Neutro Abs  3.8 1.7 - 7.7 K/uL   Lymphocytes Relative 26 %   Lymphs Abs 1.8 0.7 - 4.0 K/uL   Monocytes Relative 14 %   Monocytes Absolute 0.9 0.1 - 1.0 K/uL   Eosinophils Relative 2 %   Eosinophils Absolute 0.2 0.0 - 0.7 K/uL   Basophils Relative 0 %   Basophils Absolute 0.0 0.0 - 0.1 K/uL  Basic metabolic panel     Status: Abnormal   Collection Time: 07/25/15 11:15 PM  Result Value Ref Range   Sodium 139 135 - 145 mmol/L   Potassium 3.3 (L) 3.5 - 5.1 mmol/L   Chloride 104 101 - 111 mmol/L   CO2 27 22 - 32 mmol/L   Glucose, Bld 105 (H) 65 - 99 mg/dL   BUN 14 6 - 20 mg/dL   Creatinine, Ser 1.04 0.61 - 1.24 mg/dL   Calcium 8.4 (L) 8.9 - 10.3 mg/dL   GFR calc non Af Amer >60 >60 mL/min   GFR calc Af Amer >60 >60 mL/min   Anion gap 8 5 - 15    Imaging Studies: Dg Chest 2 View  07/25/2015  CLINICAL DATA:  75 year old male with history of fever, nausea, cough, headache and neck pain. History of fall several weeks ago. EXAM: CHEST  2 VIEW COMPARISON:  Chest x-ray 09/13/2015. FINDINGS: Lung volumes are normal. No consolidative airspace disease. No pleural effusions. No pneumothorax. No pulmonary nodule or mass noted. Pulmonary vasculature and the cardiomediastinal silhouette are within normal limits. IMPRESSION: No radiographic evidence of acute cardiopulmonary disease. Electronically Signed   By: Vinnie Langton M.D.   On: 07/25/2015 23:49   12:16 AM Patient advised of  reassuring diagnostic studies. He was advised to have himself reevaluated should symptoms worsen or change. I see no indication for antibiotics at this time.  Final diagnoses:  Acute febrile illness   I personally performed the services described in this documentation, which was scribed in my presence. The recorded information has been reviewed and is accurate.     Shanon Rosser, MD 07/26/15 [id number]

## 2015-07-25 NOTE — ED Notes (Signed)
MD at bedside. 

## 2015-07-25 NOTE — ED Notes (Signed)
Pt states low grade fever onset today, up to 100.8. Denies abd pain, cough, or other sx other than mild nausea. Pt is afebrile in triage but did not tx fever at home. Pt is talking about head pain from a fall several weeks ago that he was seen and treated for. Ambulatory in NAD.

## 2015-08-04 ENCOUNTER — Telehealth: Payer: Self-pay | Admitting: *Deleted

## 2015-08-04 NOTE — Telephone Encounter (Signed)
(+)  urine culture.  Unable to reach by phone or letter, no further treatment received.

## 2015-09-06 ENCOUNTER — Emergency Department (HOSPITAL_BASED_OUTPATIENT_CLINIC_OR_DEPARTMENT_OTHER)
Admission: EM | Admit: 2015-09-06 | Discharge: 2015-09-06 | Disposition: A | Discharge status: Home / Self Care | Payer: Medicare Other | Attending: Dermatology | Admitting: Dermatology

## 2015-09-06 ENCOUNTER — Encounter (HOSPITAL_BASED_OUTPATIENT_CLINIC_OR_DEPARTMENT_OTHER): Payer: Self-pay | Admitting: *Deleted

## 2015-09-06 ENCOUNTER — Emergency Department (HOSPITAL_BASED_OUTPATIENT_CLINIC_OR_DEPARTMENT_OTHER): Payer: Medicare Other

## 2015-09-06 DIAGNOSIS — I10 Essential (primary) hypertension: Secondary | ICD-10-CM | POA: Insufficient documentation

## 2015-09-06 DIAGNOSIS — Z85828 Personal history of other malignant neoplasm of skin: Secondary | ICD-10-CM | POA: Diagnosis not present

## 2015-09-06 DIAGNOSIS — Z87891 Personal history of nicotine dependence: Secondary | ICD-10-CM | POA: Insufficient documentation

## 2015-09-06 DIAGNOSIS — Z79899 Other long term (current) drug therapy: Secondary | ICD-10-CM | POA: Insufficient documentation

## 2015-09-06 DIAGNOSIS — Z5321 Procedure and treatment not carried out due to patient leaving prior to being seen by health care provider: Secondary | ICD-10-CM | POA: Insufficient documentation

## 2015-09-06 DIAGNOSIS — R05 Cough: Secondary | ICD-10-CM | POA: Diagnosis not present

## 2015-09-06 NOTE — ED Notes (Signed)
Pt called times 2 without answer.

## 2015-09-06 NOTE — ED Notes (Signed)
Pt did not answer times 3 total

## 2015-09-06 NOTE — ED Triage Notes (Signed)
Pt states that he was seen on 8-16 for cough and headache at [facility]. States he had a chest xray, but does not know the results because his phone has not been walking. States cough is worse at night. Has been taking amoxicillin. Took a breathing treatment one hour prior to arrival with little relief. States he feels a little sob when laying down. C/o sweating but fevers unknown.

## 2016-01-10 ENCOUNTER — Emergency Department (HOSPITAL_BASED_OUTPATIENT_CLINIC_OR_DEPARTMENT_OTHER): Payer: Medicare Other

## 2016-01-10 ENCOUNTER — Encounter (HOSPITAL_BASED_OUTPATIENT_CLINIC_OR_DEPARTMENT_OTHER): Payer: Self-pay | Admitting: *Deleted

## 2016-01-10 ENCOUNTER — Emergency Department (HOSPITAL_BASED_OUTPATIENT_CLINIC_OR_DEPARTMENT_OTHER)
Admission: EM | Admit: 2016-01-10 | Discharge: 2016-01-10 | Disposition: A | Discharge status: Home / Self Care | Payer: Medicare Other | Attending: Emergency Medicine | Admitting: Emergency Medicine

## 2016-01-10 DIAGNOSIS — E860 Dehydration: Secondary | ICD-10-CM

## 2016-01-10 DIAGNOSIS — R110 Nausea: Secondary | ICD-10-CM

## 2016-01-10 DIAGNOSIS — R6889 Other general symptoms and signs: Secondary | ICD-10-CM

## 2016-01-10 DIAGNOSIS — I10 Essential (primary) hypertension: Secondary | ICD-10-CM | POA: Insufficient documentation

## 2016-01-10 DIAGNOSIS — Z85828 Personal history of other malignant neoplasm of skin: Secondary | ICD-10-CM | POA: Insufficient documentation

## 2016-01-10 DIAGNOSIS — R05 Cough: Secondary | ICD-10-CM

## 2016-01-10 DIAGNOSIS — Z87891 Personal history of nicotine dependence: Secondary | ICD-10-CM | POA: Diagnosis not present

## 2016-01-10 DIAGNOSIS — Z79899 Other long term (current) drug therapy: Secondary | ICD-10-CM | POA: Insufficient documentation

## 2016-01-10 DIAGNOSIS — R059 Cough, unspecified: Secondary | ICD-10-CM

## 2016-01-10 LAB — CBC WITH DIFFERENTIAL/PLATELET
BASOS PCT: 0 %
Basophils Absolute: 0 10*3/uL (ref 0.0–0.1)
EOS ABS: 0 10*3/uL (ref 0.0–0.7)
EOS PCT: 0 %
HCT: 40 % (ref 39.0–52.0)
HEMOGLOBIN: 13.7 g/dL (ref 13.0–17.0)
LYMPHS ABS: 1.8 10*3/uL (ref 0.7–4.0)
Lymphocytes Relative: 24 %
MCH: 31.9 pg (ref 26.0–34.0)
MCHC: 34.3 g/dL (ref 30.0–36.0)
MCV: 93.2 fL (ref 78.0–100.0)
MONO ABS: 0.8 10*3/uL (ref 0.1–1.0)
MONOS PCT: 11 %
Neutro Abs: 4.8 10*3/uL (ref 1.7–7.7)
Neutrophils Relative %: 65 %
Platelets: 220 10*3/uL (ref 150–400)
RBC: 4.29 MIL/uL (ref 4.22–5.81)
RDW: 12.8 % (ref 11.5–15.5)
WBC: 7.4 10*3/uL (ref 4.0–10.5)

## 2016-01-10 LAB — BASIC METABOLIC PANEL
Anion gap: 6 (ref 5–15)
BUN: 18 mg/dL (ref 6–20)
CALCIUM: 8.9 mg/dL (ref 8.9–10.3)
CHLORIDE: 108 mmol/L (ref 101–111)
CO2: 23 mmol/L (ref 22–32)
CREATININE: 1.04 mg/dL (ref 0.61–1.24)
GFR calc Af Amer: >60 mL/min (ref >60)
GFR calc non Af Amer: >60 mL/min (ref >60)
Glucose, Bld: 105 mg/dL — ABNORMAL HIGH (ref 65–99)
Potassium: 3.9 mmol/L (ref 3.5–5.1)
SODIUM: 137 mmol/L (ref 135–145)

## 2016-01-10 MED ORDER — ACETAMINOPHEN 500 MG PO TABS
1000.0000 mg | ORAL_TABLET | Freq: Once | ORAL | Status: AC
  Administered 2016-01-10: 1000 mg via ORAL
  Filled 2016-01-10: qty 2

## 2016-01-10 NOTE — ED Provider Notes (Signed)
[place] DEPT Provider Note   CSN: [id number] Arrival date & time: 01/10/16  1729 By signing my name below, I, Dyke Brackett, attest that this documentation has been prepared under the direction and in the presence of Fatima Blank, MD . Electronically Signed: Dyke Brackett, Scribe. 01/10/2016. 7:08 PM.   History   Chief Complaint Chief Complaint  Patient presents with  . Nausea    HPI Casey Moreno is a 75 y.o. male with a hx of COPD and hemachromatosis who presents to the Emergency Department complaining of sudden onset, constant nausea onset today. He states "my stomach is burning up" and hot to the touch. He has taken phenergan for nausea with no relief. He notes associated cough, congestion, rhinorrhea, generalized weakness, and headache. No OTC medications taken PTA. Pt was placed on doxycycline one week ago for a sinus infection by his PCP. Per pt, his BP was too low to have iron removed last week. He denies any abdominal pain, vomiting, or diarrhea. He is a nonsmoker, but has secondhand smoke exposure through his wife.   The history is provided by the patient. No language interpreter was used.   Past Medical History:  Diagnosis Date  . Arthritis   . Cancer ([place])   . Diverticulosis   . Hyperlipidemia   . Hypertension   . Kidney stones   . Osteoarthritis   . Renal disorder   . Skin cancer     There are no active problems to display for this patient.   Past Surgical History:  Procedure Laterality Date  . HERNIA REPAIR    . PROSTATE ABLATION    . SKIN BIOPSY      Home Medications    Prior to Admission medications   Medication Sig Start Date End Date Taking? Authorizing Provider  Promethazine HCl (PHENERGAN PO) Take by mouth.   Yes Historical Provider, MD  albuterol (PROVENTIL HFA;VENTOLIN HFA) 108 (90 BASE) MCG/ACT inhaler Inhale 2 puffs into the lungs every 4 (four) hours as needed for wheezing or shortness of breath.    Historical Provider, MD    amLODipine (NORVASC) 5 MG tablet Take 5 mg by mouth daily.    Historical Provider, MD  amoxicillin (AMOXIL) 500 MG tablet Take 500 mg by mouth 3 (three) times daily.    Historical Provider, MD  atorvastatin (LIPITOR) 40 MG tablet Take 40 mg by mouth daily.    Historical Provider, MD  buPROPion (WELLBUTRIN) 100 MG tablet Take 30 mg by mouth once.    Historical Provider, MD  carvedilol (COREG) 3.125 MG tablet Take 3.125 mg by mouth 2 (two) times daily with a meal.    Historical Provider, MD  cephALEXin (KEFLEX) 500 MG capsule Take 1 capsule (500 mg total) by mouth 4 (four) times daily. 05/31/15   Ezequiel Essex, MD  cyclobenzaprine (FLEXERIL) 5 MG tablet Take 1 tablet (5 mg total) by mouth 3 (three) times daily as needed for muscle spasms. 01/10/15   Quintella Reichert, MD  Dextromethorphan-Guaifenesin [facility] DM MAXIMUM STRENGTH) 60-1200 MG TB12 Take 1 tablet by mouth 2 (two) times daily as needed (for cough). 02/08/15   John Molpus, MD  doxycycline (VIBRAMYCIN) 100 MG capsule Take 1 capsule (100 mg total) by mouth 2 (two) times daily. One po bid x 7 days 02/08/15   Shanon Rosser, MD  furosemide (LASIX) 40 MG tablet Take 1 tablet (40 mg total) by mouth daily. [id number]   Delora Fuel, MD  HYDROcodone-acetaminophen (NORCO/VICODIN) 5-325 MG tablet Take 1 tablet by  mouth every 4 (four) hours as needed. 05/31/15   Ezequiel Essex, MD  lubiprostone (AMITIZA) 8 MCG capsule Take 5 mcg by mouth 2 (two) times daily with a meal.    Historical Provider, MD  nitroGLYCERIN (NITROSTAT) 0.4 MG SL tablet Place 0.4 mg under the tongue every 5 (five) minutes as needed for chest pain.    Historical Provider, MD  omeprazole (PRILOSEC) 40 MG capsule Take 40 mg by mouth daily.    Historical Provider, MD  ondansetron (ZOFRAN ODT) 8 MG disintegrating tablet Take 1 tablet (8 mg total) by mouth every 8 (eight) hours as needed for nausea or vomiting. 02/06/15   Kandra Nicolas, MD  tamsulosin (FLOMAX) 0.4 MG CAPS capsule Take 0.4 mg by  mouth daily.    Historical Provider, MD    Family History No family history on file.  Social History Social History  Substance Use Topics  . Smoking status: Former Research scientist (life sciences)  . Smokeless tobacco: Never Used  . Alcohol use No     Allergies   Pneumococcal vaccines; Sulfa antibiotics; and Aspirin   Review of Systems Review of Systems 10 systems reviewed and all are negative for acute change except as noted in the HPI.  Physical Exam Updated Vital Signs BP 144/98 (BP Location: Right Arm)   Pulse 83   Temp 97.7 F (36.5 C) (Oral)   Resp 18   Ht 6\' 1"  (1.854 m)   Wt 197 lb (89.4 kg)   SpO2 99%   BMI 25.99 kg/m   Physical Exam  Constitutional: He is oriented to person, place, and time. He appears well-developed and well-nourished. No distress.  HENT:  Head: Normocephalic and atraumatic.  Nose: Nose normal.  Eyes: Conjunctivae and EOM are normal. Pupils are equal, round, and reactive to light. Right eye exhibits no discharge. Left eye exhibits no discharge. No scleral icterus.  Neck: Normal range of motion. Neck supple.  Cardiovascular: Normal rate and regular rhythm.  Exam reveals no gallop and no friction rub.   No murmur heard. Pulmonary/Chest: Effort normal and breath sounds normal. No stridor. No respiratory distress. He has no rales.  Abdominal: Soft. He exhibits no distension. There is no tenderness.  Musculoskeletal: He exhibits no edema or tenderness.  Neurological: He is alert and oriented to person, place, and time.  Skin: Skin is warm and dry. No rash noted. He is not diaphoretic. No erythema.  Psychiatric: He has a normal mood and affect.  Vitals reviewed.     ED Treatments / Results  DIAGNOSTIC STUDIES:  Oxygen Saturation is 99% on RA, normal by my interpretation.    COORDINATION OF CARE:  7:02 PM Discussed treatment plan with pt at bedside and pt agreed to plan.   Labs (all labs ordered are listed, but only abnormal results are displayed) Labs  Reviewed  BASIC METABOLIC PANEL - Abnormal; Notable for the following:       Result Value   Glucose, Bld 105 (*)    All other components within normal limits  CBC WITH DIFFERENTIAL/PLATELET    EKG  EKG Interpretation None       Radiology Dg Chest 2 View  Result Date: 01/10/2016 CLINICAL DATA:  Cough, congestion and weakness for a few days EXAM: CHEST  2 VIEW COMPARISON:  CT from 02/21/2015, CXR from 09/07/2015 FINDINGS: Heart size is top-normal. There is uncoiling of the thoracic aorta without definite aneurysm. Both lungs are clear. Right apical linear scarring. The visualized skeletal structures are unremarkable. IMPRESSION: No active  cardiopulmonary disease. Electronically Signed   By: Ashley Royalty M.D.   On: 01/10/2016 19:39    Procedures Procedures (including critical care time)  Medications Ordered in ED Medications  acetaminophen (TYLENOL) tablet 1,000 mg (1,000 mg Oral Given 01/10/16 1923)     Initial Impression / Assessment and Plan / ED Course  I have reviewed the triage vital signs and the nursing notes.  Pertinent labs & imaging results that were available during my care of the patient were reviewed by me and considered in my medical decision making (see chart for details).  Clinical Course    Labs grossly reassuring. Chest x-ray without evidence of pneumonia. Presentation consistent with possible viral process given the flulike symptoms. Patient provided with IV fluids which had significant improvement in his symptomatology. Feel he is appropriate for discharge with strict return precautions.  Final Clinical Impressions(s) / ED Diagnoses   Final diagnoses:  Cough  Nausea  Dehydration  Flu-like symptoms   Disposition: Discharge  Condition: Good  I have discussed the results, Dx and Tx plan with the patient who expressed understanding and agree(s) with the plan. Discharge instructions discussed at great length. The patient was given strict return  precautions who verbalized understanding of the instructions. No further questions at time of discharge.    Discharge Medication List as of 01/10/2016  9:09 PM      Follow Up: Verdell Carmine, MD [address] [place] [address] [phone]  Schedule an appointment as soon as possible for a visit  in 3-5 days, If symptoms do not improve or  worsen   I personally performed the services described in this documentation, which was scribed in my presence. The recorded information has been reviewed and is accurate.        Fatima Blank, MD 01/12/16 1146

## 2016-01-10 NOTE — ED Triage Notes (Signed)
Nausea. States his iron level is too high and he gets it removed every 3 weeks. His BP was low so it could not be removed last visit.

## 2016-01-10 NOTE — ED Notes (Signed)
ED Provider at bedside. 

## 2016-01-27 ENCOUNTER — Emergency Department (HOSPITAL_BASED_OUTPATIENT_CLINIC_OR_DEPARTMENT_OTHER)
Admission: EM | Admit: 2016-01-27 | Discharge: 2016-01-27 | Disposition: A | Discharge status: Home / Self Care | Payer: Medicare Other | Attending: Emergency Medicine | Admitting: Emergency Medicine

## 2016-01-27 ENCOUNTER — Encounter (HOSPITAL_BASED_OUTPATIENT_CLINIC_OR_DEPARTMENT_OTHER): Payer: Self-pay

## 2016-01-27 ENCOUNTER — Emergency Department (HOSPITAL_BASED_OUTPATIENT_CLINIC_OR_DEPARTMENT_OTHER): Payer: Medicare Other

## 2016-01-27 DIAGNOSIS — I10 Essential (primary) hypertension: Secondary | ICD-10-CM | POA: Diagnosis not present

## 2016-01-27 DIAGNOSIS — Z85828 Personal history of other malignant neoplasm of skin: Secondary | ICD-10-CM | POA: Diagnosis not present

## 2016-01-27 DIAGNOSIS — K8020 Calculus of gallbladder without cholecystitis without obstruction: Secondary | ICD-10-CM | POA: Insufficient documentation

## 2016-01-27 DIAGNOSIS — K118 Other diseases of salivary glands: Secondary | ICD-10-CM | POA: Insufficient documentation

## 2016-01-27 DIAGNOSIS — R52 Pain, unspecified: Secondary | ICD-10-CM

## 2016-01-27 DIAGNOSIS — R1084 Generalized abdominal pain: Secondary | ICD-10-CM | POA: Diagnosis present

## 2016-01-27 DIAGNOSIS — Z87891 Personal history of nicotine dependence: Secondary | ICD-10-CM | POA: Diagnosis not present

## 2016-01-27 DIAGNOSIS — Z79899 Other long term (current) drug therapy: Secondary | ICD-10-CM | POA: Diagnosis not present

## 2016-01-27 LAB — CBC WITH DIFFERENTIAL/PLATELET
BASOS ABS: 0 10*3/uL (ref 0.0–0.1)
Basophils Relative: 0 %
Eosinophils Absolute: 0.1 10*3/uL (ref 0.0–0.7)
Eosinophils Relative: 1 %
HEMATOCRIT: 42.1 % (ref 39.0–52.0)
Hemoglobin: 14.5 g/dL (ref 13.0–17.0)
LYMPHS PCT: 32 %
Lymphs Abs: 2.1 10*3/uL (ref 0.7–4.0)
MCH: 31.9 pg (ref 26.0–34.0)
MCHC: 34.4 g/dL (ref 30.0–36.0)
MCV: 92.5 fL (ref 78.0–100.0)
Monocytes Absolute: 0.6 10*3/uL (ref 0.1–1.0)
Monocytes Relative: 9 %
NEUTROS ABS: 3.7 10*3/uL (ref 1.7–7.7)
Neutrophils Relative %: 58 %
PLATELETS: 192 10*3/uL (ref 150–400)
RBC: 4.55 MIL/uL (ref 4.22–5.81)
RDW: 12.6 % (ref 11.5–15.5)
WBC: 6.5 10*3/uL (ref 4.0–10.5)

## 2016-01-27 LAB — COMPREHENSIVE METABOLIC PANEL
ALT: 19 U/L (ref 17–63)
ANION GAP: 7 (ref 5–15)
AST: 32 U/L (ref 15–41)
Albumin: 4.2 g/dL (ref 3.5–5.0)
Alkaline Phosphatase: 56 U/L (ref 38–126)
BILIRUBIN TOTAL: 0.9 mg/dL (ref 0.3–1.2)
BUN: 20 mg/dL (ref 6–20)
CO2: 25 mmol/L (ref 22–32)
Calcium: 8.9 mg/dL (ref 8.9–10.3)
Chloride: 105 mmol/L (ref 101–111)
Creatinine, Ser: 1.13 mg/dL (ref 0.61–1.24)
GFR calc Af Amer: >60 mL/min (ref >60)
Glucose, Bld: 103 mg/dL — ABNORMAL HIGH (ref 65–99)
POTASSIUM: 3.9 mmol/L (ref 3.5–5.1)
Sodium: 137 mmol/L (ref 135–145)
TOTAL PROTEIN: 7 g/dL (ref 6.5–8.1)

## 2016-01-27 LAB — TROPONIN I: Troponin I: <0.03 ng/mL (ref <0.03)

## 2016-01-27 LAB — LIPASE, BLOOD: Lipase: 47 U/L (ref 11–51)

## 2016-01-27 MED ORDER — IOPAMIDOL (ISOVUE-300) INJECTION 61%
100.0000 mL | Freq: Once | INTRAVENOUS | Status: AC | PRN
  Administered 2016-01-27: 75 mL via INTRAVENOUS

## 2016-01-27 MED ORDER — HYDROCODONE-ACETAMINOPHEN 5-325 MG PO TABS: 1.0000 | ORAL_TABLET | ORAL | 0 refills | Status: DC | PRN

## 2016-01-27 MED ORDER — GI COCKTAIL ~~LOC~~
30.0000 mL | Freq: Once | ORAL | Status: AC
Start: 2016-01-27 — End: 2016-01-27
  Administered 2016-01-27: 30 mL via ORAL
  Filled 2016-01-27: qty 30

## 2016-01-27 MED FILL — HYDROCODON-APAP 5-325: 5-325 | 2 days supply | Qty: 10 | Fill #0

## 2016-01-27 NOTE — ED Provider Notes (Signed)
[facility] Provider Note   CSN: [id number] Arrival date & time: 01/27/16  0610     History   Chief Complaint Chief Complaint  Patient presents with  . Abdominal Pain    HPI Casey Moreno is a 76 y.o. male.  The history is provided by the patient. No language interpreter was used.  Abdominal Pain      Casey Moreno is a 76 y.o. male who presents to the Emergency Department complaining of abdominal pain.  He reports 3 days of central abdominal pain. The pain is described as burning in nature and waxing and waning. When it initially started he was seen at [facility] with labs and CT abdomen obtained. At that time he had associated vomiting. His vomiting has resolved but he does have ongoing nausea. Symptoms had transiently improved only to worsen significantly today. He has mild associated chest discomfort. Diaphoresis last night, none currently. No shortness of breath, diarrhea, dysuria. He saw his gastroenterologist for the symptoms 2 days ago and is scheduled to have an ultrasound tomorrow and an EGD sometime in the future.  Past Medical History:  Diagnosis Date  . Arthritis   . Cancer ([place])   . Diverticulosis   . Hyperlipidemia   . Hypertension   . Kidney stones   . Osteoarthritis   . Renal disorder   . Skin cancer     There are no active problems to display for this patient.   Past Surgical History:  Procedure Laterality Date  . HERNIA REPAIR    . PROSTATE ABLATION    . SKIN BIOPSY         Home Medications    Prior to Admission medications   Medication Sig Start Date End Date Taking? Authorizing Provider  albuterol (PROVENTIL HFA;VENTOLIN HFA) 108 (90 BASE) MCG/ACT inhaler Inhale 2 puffs into the lungs every 4 (four) hours as needed for wheezing or shortness of breath.    Historical Provider, MD  amLODipine (NORVASC) 5 MG tablet Take 5 mg by mouth daily.    Historical Provider, MD  amoxicillin (AMOXIL) 500 MG tablet  Take 500 mg by mouth 3 (three) times daily.    Historical Provider, MD  atorvastatin (LIPITOR) 40 MG tablet Take 40 mg by mouth daily.    Historical Provider, MD  buPROPion (WELLBUTRIN) 100 MG tablet Take 30 mg by mouth once.    Historical Provider, MD  carvedilol (COREG) 3.125 MG tablet Take 3.125 mg by mouth 2 (two) times daily with a meal.    Historical Provider, MD  cephALEXin (KEFLEX) 500 MG capsule Take 1 capsule (500 mg total) by mouth 4 (four) times daily. 05/31/15   Ezequiel Essex, MD  cyclobenzaprine (FLEXERIL) 5 MG tablet Take 1 tablet (5 mg total) by mouth 3 (three) times daily as needed for muscle spasms. 01/10/15   Quintella Reichert, MD  Dextromethorphan-Guaifenesin [facility] DM MAXIMUM STRENGTH) 60-1200 MG TB12 Take 1 tablet by mouth 2 (two) times daily as needed (for cough). 02/08/15   John Molpus, MD  doxycycline (VIBRAMYCIN) 100 MG capsule Take 1 capsule (100 mg total) by mouth 2 (two) times daily. One po bid x 7 days 02/08/15   Shanon Rosser, MD  furosemide (LASIX) 40 MG tablet Take 1 tablet (40 mg total) by mouth daily. [id number]   Delora Fuel, MD  HYDROcodone-acetaminophen (NORCO/VICODIN) 5-325 MG tablet Take 1 tablet by mouth every 4 (four) hours as needed. 05/31/15   Ezequiel Essex, MD  lubiprostone (AMITIZA) 8  MCG capsule Take 5 mcg by mouth 2 (two) times daily with a meal.    Historical Provider, MD  nitroGLYCERIN (NITROSTAT) 0.4 MG SL tablet Place 0.4 mg under the tongue every 5 (five) minutes as needed for chest pain.    Historical Provider, MD  omeprazole (PRILOSEC) 40 MG capsule Take 40 mg by mouth daily.    Historical Provider, MD  ondansetron (ZOFRAN ODT) 8 MG disintegrating tablet Take 1 tablet (8 mg total) by mouth every 8 (eight) hours as needed for nausea or vomiting. 02/06/15   Kandra Nicolas, MD  Promethazine HCl (PHENERGAN PO) Take by mouth.    Historical Provider, MD  tamsulosin (FLOMAX) 0.4 MG CAPS capsule Take 0.4 mg by mouth daily.    Historical Provider, MD    Family  History No family history on file.  Social History Social History  Substance Use Topics  . Smoking status: Former Research scientist (life sciences)  . Smokeless tobacco: Never Used  . Alcohol use No     Allergies   Pneumococcal vaccines; Sulfa antibiotics; and Aspirin   Review of Systems Review of Systems  Gastrointestinal: Positive for abdominal pain.  All other systems reviewed and are negative.    Physical Exam Updated Vital Signs BP 124/95 (BP Location: Left Arm)   Pulse 92   Temp 97.8 F (36.6 C) (Oral)   Resp 22   Ht 6\' 1"  (1.854 m)   Wt 193 lb (87.5 kg)   SpO2 100%   BMI 25.46 kg/m   Physical Exam  Constitutional: He is oriented to person, place, and time. He appears well-developed and well-nourished.  HENT:  Head: Normocephalic and atraumatic.  Cardiovascular: Normal rate and regular rhythm.   No murmur heard. Pulmonary/Chest: Effort normal and breath sounds normal. No respiratory distress.  Abdominal: Soft. There is no tenderness. There is no rebound and no guarding.  Musculoskeletal: He exhibits no edema or tenderness.  Neurological: He is alert and oriented to person, place, and time.  Skin: Skin is warm and dry.  Psychiatric: He has a normal mood and affect. His behavior is normal.  Nursing note and vitals reviewed.    ED Treatments / Results  Labs (all labs ordered are listed, but only abnormal results are displayed) Labs Reviewed  COMPREHENSIVE METABOLIC PANEL  CBC WITH DIFFERENTIAL/PLATELET  LIPASE, BLOOD    EKG  EKG Interpretation None       Radiology No results found.  Procedures Procedures (including critical care time)  Medications Ordered in ED Medications  gi cocktail (Maalox,Lidocaine,Donnatal) (not administered)     Initial Impression / Assessment and Plan / ED Course  I have reviewed the triage vital signs and the nursing notes.  Pertinent labs & imaging results that were available during my care of the patient were reviewed by me and  considered in my medical decision making (see chart for details).  Clinical Course     Patient here for evaluation of abdominal pain. No significant tenderness on examination. Patient care transferred pending labs and assessment of response to treatment.  Final Clinical Impressions(s) / ED Diagnoses   Final diagnoses:  None    New Prescriptions New Prescriptions   No medications on file     Quintella Reichert, MD 01/27/16 2251

## 2016-01-27 NOTE — ED Triage Notes (Signed)
Pt c/o center abdominal pain with nausea; states was seen at [facility] on Sunday had a full work up with all neg. findings; states was given medicine and pain eased off until yesterday; states appt for a [place] tomorrow

## 2016-01-27 NOTE — Discharge Instructions (Signed)
You will need a biopsy of the mass on your face as this mass is concerning for cancer.

## 2016-01-27 NOTE — ED Provider Notes (Signed)
Pt signed out to from Dr. Ralene Bathe.  Pt's pain treated with a GI cocktail which resolved his pain.  Pt also asked me about a mass that he has on the left side of his face.  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  The pt requested GB US today which did show small gallstones, but no cholelithiasis.  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  Pt is given the number of ENT to f/u to obtain a biopsy.  Pt also told to call his pcp to let her know the results.  The pt does have good pcp and GI follow up for his abdominal pain.  He knows to return if worse.   Isla Pence, MD 01/27/16 1012

## 2016-01-27 NOTE — ED Notes (Signed)
Patient transported to CT 

## 2016-02-14 ENCOUNTER — Encounter (HOSPITAL_BASED_OUTPATIENT_CLINIC_OR_DEPARTMENT_OTHER): Payer: Self-pay | Admitting: Emergency Medicine

## 2016-02-14 ENCOUNTER — Emergency Department (HOSPITAL_BASED_OUTPATIENT_CLINIC_OR_DEPARTMENT_OTHER): Payer: Medicare Other

## 2016-02-14 ENCOUNTER — Emergency Department (HOSPITAL_BASED_OUTPATIENT_CLINIC_OR_DEPARTMENT_OTHER)
Admission: EM | Admit: 2016-02-14 | Discharge: 2016-02-14 | Disposition: A | Discharge status: Home / Self Care | Payer: Medicare Other | Attending: Emergency Medicine | Admitting: Emergency Medicine

## 2016-02-14 DIAGNOSIS — R110 Nausea: Secondary | ICD-10-CM | POA: Diagnosis not present

## 2016-02-14 DIAGNOSIS — Z87891 Personal history of nicotine dependence: Secondary | ICD-10-CM | POA: Diagnosis not present

## 2016-02-14 DIAGNOSIS — R1030 Lower abdominal pain, unspecified: Secondary | ICD-10-CM | POA: Diagnosis present

## 2016-02-14 DIAGNOSIS — R1084 Generalized abdominal pain: Secondary | ICD-10-CM

## 2016-02-14 DIAGNOSIS — Z85828 Personal history of other malignant neoplasm of skin: Secondary | ICD-10-CM | POA: Diagnosis not present

## 2016-02-14 DIAGNOSIS — I10 Essential (primary) hypertension: Secondary | ICD-10-CM | POA: Insufficient documentation

## 2016-02-14 LAB — CBC WITH DIFFERENTIAL/PLATELET
Basophils Absolute: 0 10*3/uL (ref 0.0–0.1)
Basophils Relative: 0 %
EOS ABS: 0.1 10*3/uL (ref 0.0–0.7)
Eosinophils Relative: 1 %
HCT: 39.3 % (ref 39.0–52.0)
Hemoglobin: 13.5 g/dL (ref 13.0–17.0)
LYMPHS ABS: 1.8 10*3/uL (ref 0.7–4.0)
Lymphocytes Relative: 31 %
MCH: 32.2 pg (ref 26.0–34.0)
MCHC: 34.4 g/dL (ref 30.0–36.0)
MCV: 93.8 fL (ref 78.0–100.0)
MONO ABS: 0.5 10*3/uL (ref 0.1–1.0)
Monocytes Relative: 9 %
Neutro Abs: 3.4 10*3/uL (ref 1.7–7.7)
Neutrophils Relative %: 59 %
PLATELETS: 192 10*3/uL (ref 150–400)
RBC: 4.19 MIL/uL — AB (ref 4.22–5.81)
RDW: 12.5 % (ref 11.5–15.5)
WBC: 5.8 10*3/uL (ref 4.0–10.5)

## 2016-02-14 LAB — URINALYSIS, ROUTINE W REFLEX MICROSCOPIC
Bilirubin Urine: NEGATIVE
Glucose, UA: NEGATIVE mg/dL
Hgb urine dipstick: NEGATIVE
Ketones, ur: NEGATIVE mg/dL
Leukocytes, UA: NEGATIVE
Nitrite: NEGATIVE
Protein, ur: NEGATIVE mg/dL
Specific Gravity, Urine: 1.012 (ref 1.005–1.030)
pH: 6 (ref 5.0–8.0)

## 2016-02-14 LAB — COMPREHENSIVE METABOLIC PANEL
ALT: 19 U/L (ref 17–63)
AST: 28 U/L (ref 15–41)
Albumin: 3.9 g/dL (ref 3.5–5.0)
Alkaline Phosphatase: 45 U/L (ref 38–126)
Anion gap: 5 (ref 5–15)
BUN: 11 mg/dL (ref 6–20)
CO2: 26 mmol/L (ref 22–32)
Calcium: 8.8 mg/dL — ABNORMAL LOW (ref 8.9–10.3)
Chloride: 106 mmol/L (ref 101–111)
Creatinine, Ser: 1.1 mg/dL (ref 0.61–1.24)
GFR calc Af Amer: >60 mL/min (ref >60)
GFR calc non Af Amer: >60 mL/min (ref >60)
Glucose, Bld: 109 mg/dL — ABNORMAL HIGH (ref 65–99)
Potassium: 3.8 mmol/L (ref 3.5–5.1)
Sodium: 137 mmol/L (ref 135–145)
Total Bilirubin: 1 mg/dL (ref 0.3–1.2)
Total Protein: 6.5 g/dL (ref 6.5–8.1)

## 2016-02-14 LAB — LIPASE, BLOOD: LIPASE: 43 U/L (ref 11–51)

## 2016-02-14 MED ORDER — MORPHINE SULFATE (PF) 4 MG/ML IV SOLN
4.0000 mg | Freq: Once | INTRAVENOUS | Status: AC
  Administered 2016-02-14: 4 mg via INTRAVENOUS
  Filled 2016-02-14: qty 1

## 2016-02-14 MED ORDER — ONDANSETRON HCL 4 MG/2ML IJ SOLN
4.0000 mg | Freq: Once | INTRAMUSCULAR | Status: AC
  Administered 2016-02-14: 4 mg via INTRAVENOUS
  Filled 2016-02-14: qty 2

## 2016-02-14 MED ORDER — SODIUM CHLORIDE 0.9 % IV BOLUS (SEPSIS)
1000.0000 mL | Freq: Once | INTRAVENOUS | Status: AC
Start: 2016-02-14 — End: 2016-02-14
  Administered 2016-02-14: 1000 mL via INTRAVENOUS

## 2016-02-14 MED ORDER — IOPAMIDOL (ISOVUE-300) INJECTION 61%
100.0000 mL | Freq: Once | INTRAVENOUS | Status: AC | PRN
  Administered 2016-02-14: 100 mL via INTRAVENOUS

## 2016-02-14 NOTE — ED Triage Notes (Signed)
Patient reports that he has had pain to his lower pelvic region x 2 -3 days. Was herre on the 10th with the same, was told that he has gallstones

## 2016-02-14 NOTE — ED Notes (Signed)
Pt discharge to home with family.

## 2016-02-14 NOTE — ED Notes (Signed)
CT Tech at bedside

## 2016-02-14 NOTE — ED Provider Notes (Signed)
[facility] Provider Note   CSN: [id number] Arrival date & time: 02/14/16  1649  By signing my name below, I, Jeanell Sparrow, attest that this documentation has been prepared under the direction and in the presence of Gareth Morgan, MD. Electronically Signed: Jeanell Sparrow, Scribe. 02/14/2016. 5:49 PM.  History   Chief Complaint Chief Complaint  Patient presents with  . Abdominal Pain   The history is provided by the patient and medical records. No language interpreter was used.   HPI Comments: Casey Moreno is a 76 y.o. male who presents to the Emergency Department complaining of constant moderate lower abdominal pain that started about 3 days ago. He was seen in the ED on 01/27/16 for the same complaint due to gall stones. Today's pain is similar but worse than on 01/27/16. He tried advil without relief. No alleviating factors. His pain is exacerbated by laying down. He describes the pain as a burning sensation radiating upward to the entire abdomen. He currently rates the pain as a 6/10. He has associated nausea that is exacerbated by eating and relieved by zofran. He admits to a hx of hernia repair. He denies any fever, chest pain, SOB, vomiting, diarrhea, constipation, or dysuria.     PCP: Verdell Carmine., MD  Past Medical History:  Diagnosis Date  . Arthritis   . Cancer ([place])   . Diverticulosis   . Hyperlipidemia   . Hypertension   . Kidney stones   . Osteoarthritis   . Renal disorder   . Skin cancer     There are no active problems to display for this patient.   Past Surgical History:  Procedure Laterality Date  . HERNIA REPAIR    . PROSTATE ABLATION    . SKIN BIOPSY         Home Medications    Prior to Admission medications   Medication Sig Start Date End Date Taking? Authorizing Provider  albuterol (PROVENTIL HFA;VENTOLIN HFA) 108 (90 BASE) MCG/ACT inhaler Inhale 2 puffs into the lungs every 4 (four) hours as needed for wheezing or shortness  of breath.    Historical Provider, MD  amLODipine (NORVASC) 5 MG tablet Take 5 mg by mouth daily.    Historical Provider, MD  amoxicillin (AMOXIL) 500 MG tablet Take 500 mg by mouth 3 (three) times daily.    Historical Provider, MD  atorvastatin (LIPITOR) 40 MG tablet Take 40 mg by mouth daily.    Historical Provider, MD  buPROPion (WELLBUTRIN) 100 MG tablet Take 30 mg by mouth once.    Historical Provider, MD  carvedilol (COREG) 3.125 MG tablet Take 3.125 mg by mouth 2 (two) times daily with a meal.    Historical Provider, MD  cephALEXin (KEFLEX) 500 MG capsule Take 1 capsule (500 mg total) by mouth 4 (four) times daily. 05/31/15   Ezequiel Essex, MD  cyclobenzaprine (FLEXERIL) 5 MG tablet Take 1 tablet (5 mg total) by mouth 3 (three) times daily as needed for muscle spasms. 01/10/15   Quintella Reichert, MD  Dextromethorphan-Guaifenesin [facility] DM MAXIMUM STRENGTH) 60-1200 MG TB12 Take 1 tablet by mouth 2 (two) times daily as needed (for cough). 02/08/15   John Molpus, MD  doxycycline (VIBRAMYCIN) 100 MG capsule Take 1 capsule (100 mg total) by mouth 2 (two) times daily. One po bid x 7 days 02/08/15   Shanon Rosser, MD  furosemide (LASIX) 40 MG tablet Take 1 tablet (40 mg total) by mouth daily. [id number]   Delora Fuel, MD  HYDROcodone-acetaminophen (NORCO/VICODIN)  5-325 MG tablet Take 1 tablet by mouth every 4 (four) hours as needed. 01/27/16   Isla Pence, MD  lubiprostone (AMITIZA) 8 MCG capsule Take 5 mcg by mouth 2 (two) times daily with a meal.    Historical Provider, MD  nitroGLYCERIN (NITROSTAT) 0.4 MG SL tablet Place 0.4 mg under the tongue every 5 (five) minutes as needed for chest pain.    Historical Provider, MD  omeprazole (PRILOSEC) 40 MG capsule Take 40 mg by mouth daily.    Historical Provider, MD  ondansetron (ZOFRAN ODT) 8 MG disintegrating tablet Take 1 tablet (8 mg total) by mouth every 8 (eight) hours as needed for nausea or vomiting. 02/06/15   Kandra Nicolas, MD  Promethazine HCl  (PHENERGAN PO) Take by mouth.    Historical Provider, MD  tamsulosin (FLOMAX) 0.4 MG CAPS capsule Take 0.4 mg by mouth daily.    Historical Provider, MD    Family History History reviewed. No pertinent family history.  Social History Social History  Substance Use Topics  . Smoking status: Former Research scientist (life sciences)  . Smokeless tobacco: Never Used  . Alcohol use No     Allergies   Pneumococcal vaccines; Sulfa antibiotics; and Aspirin   Review of Systems Review of Systems  Constitutional: Negative for fever.  HENT: Negative for sore throat.   Eyes: Negative for visual disturbance.  Respiratory: Negative for shortness of breath.   Cardiovascular: Negative for chest pain.  Gastrointestinal: Positive for abdominal pain and nausea. Negative for diarrhea and vomiting.  Genitourinary: Negative for difficulty urinating and dysuria.  Musculoskeletal: Negative for back pain and neck stiffness.  Skin: Negative for rash.  Neurological: Negative for syncope and headaches.     Physical Exam Updated Vital Signs BP (!) 156/125 (BP Location: Left Arm) Comment: Taken twice, pt could not sit still during BP  Pulse 89   Temp 98 F (36.7 C) (Oral)   Resp 21   Ht 6\' 1"  (1.854 m)   Wt 185 lb (83.9 kg)   SpO2 98%   BMI 24.41 kg/m   Physical Exam  Constitutional: He is oriented to person, place, and time. He appears well-developed and well-nourished. No distress.  HENT:  Head: Normocephalic and atraumatic.  Eyes: Conjunctivae and EOM are normal.  Neck: Normal range of motion. Neck supple.  Cardiovascular: Normal rate, regular rhythm, normal heart sounds and intact distal pulses.  Exam reveals no gallop and no friction rub.   No murmur heard. Pulmonary/Chest: Effort normal and breath sounds normal. No respiratory distress. He has no wheezes. He has no rales.  Abdominal: Soft. He exhibits no distension. There is tenderness. There is positive Murphy's sign. There is no guarding.  Diffuse abdominal  TTP, worse on LLQ and SP. No CVA TTP.  Musculoskeletal: Normal range of motion. He exhibits no edema.  Neurological: He is alert and oriented to person, place, and time.  Skin: Skin is warm and dry. He is not diaphoretic.  Psychiatric: He has a normal mood and affect.  Nursing note and vitals reviewed.    ED Treatments / Results  DIAGNOSTIC STUDIES: Oxygen Saturation is 98% on RA, normal by my interpretation.    COORDINATION OF CARE: 5:53 PM- Pt advised of plan for treatment and pt agrees.  Labs (all labs ordered are listed, but only abnormal results are displayed) Labs Reviewed  CBC WITH DIFFERENTIAL/PLATELET - Abnormal; Notable for the following:       Result Value   RBC 4.19 (*)    All  other components within normal limits  COMPREHENSIVE METABOLIC PANEL - Abnormal; Notable for the following:    Glucose, Bld 109 (*)    Calcium 8.8 (*)    All other components within normal limits  URINALYSIS, ROUTINE W REFLEX MICROSCOPIC  LIPASE, BLOOD    EKG  EKG Interpretation None       Radiology Ct Abdomen Pelvis W Contrast  Result Date: 02/14/2016 CLINICAL DATA:  76 year old male with abdominal and pelvic pain for 3 days. EXAM: CT ABDOMEN AND PELVIS WITH CONTRAST TECHNIQUE: Multidetector CT imaging of the abdomen and pelvis was performed using the standard protocol following bolus administration of intravenous contrast. CONTRAST:  121mL ISOVUE-300 IOPAMIDOL (ISOVUE-300) INJECTION 61% COMPARISON:  01/24/2016 and prior CTs FINDINGS: Lower chest: No acute abnormality Hepatobiliary: The liver and gallbladder are unremarkable except for tiny hepatic cysts. There is no evidence of biliary dilatation. Pancreas: Unremarkable Spleen: Unremarkable Adrenals/Urinary Tract: The kidneys, adrenal glands and bladder are unremarkable except for renal cortical atrophy and a left bladder diverticulum. Stomach/Bowel: Stomach is within normal limits. Appendix appears normal. No evidence of bowel wall  thickening, distention, or inflammatory changes. Vascular/Lymphatic: Aortic atherosclerosis. No enlarged abdominal or pelvic lymph nodes. Reproductive: Prostate enlargement and ablation changes again noted. Other: No free fluid, focal collection or pneumoperitoneum. Musculoskeletal: No acute or significant osseous findings. IMPRESSION: No acute abnormality. Abdominal aortic atherosclerosis. Electronically Signed   By: Margarette Canada M.D.   On: 02/14/2016 20:12    Procedures Procedures (including critical care time)  Medications Ordered in ED Medications  sodium chloride 0.9 % bolus 1,000 mL (0 mLs Intravenous Stopped 02/14/16 1952)  morphine 4 MG/ML injection 4 mg (4 mg Intravenous Given 02/14/16 1813)  ondansetron (ZOFRAN) injection 4 mg (4 mg Intravenous Given 02/14/16 1812)  iopamidol (ISOVUE-300) 61 % injection 100 mL (100 mLs Intravenous Contrast Given 02/14/16 1940)     Initial Impression / Assessment and Plan / ED Course  I have reviewed the triage vital signs and the nursing notes.  Pertinent labs & imaging results that were available during my care of the patient were reviewed by me and considered in my medical decision making (see chart for details).     76yo male with hx of diverticulosis, htn, hlpd, presents with concern for abdominal pain, nausea.  CT abdomen pelvis obtained without acute abnormalities. Doubt cholecystitis given CT appearance, and pt describing primarily lower abdominal pain.  Labs WNL.  Recommend PCP follow up. Patient discharged in stable condition with understanding of reasons to return.   Final Clinical Impressions(s) / ED Diagnoses   Final diagnoses:  Generalized abdominal pain    New Prescriptions Discharge Medication List as of 02/14/2016  8:57 PM     I personally performed the services described in this documentation, which was scribed in my presence. The recorded information has been reviewed and is accurate.     Gareth Morgan, MD 02/15/16  [phone]

## 2016-03-09 ENCOUNTER — Encounter (HOSPITAL_BASED_OUTPATIENT_CLINIC_OR_DEPARTMENT_OTHER): Payer: Self-pay | Admitting: Emergency Medicine

## 2016-03-09 ENCOUNTER — Emergency Department (HOSPITAL_BASED_OUTPATIENT_CLINIC_OR_DEPARTMENT_OTHER)
Admission: EM | Admit: 2016-03-09 | Discharge: 2016-03-09 | Disposition: A | Discharge status: Home / Self Care | Payer: Medicare Other | Attending: Emergency Medicine | Admitting: Emergency Medicine

## 2016-03-09 DIAGNOSIS — Z87891 Personal history of nicotine dependence: Secondary | ICD-10-CM | POA: Insufficient documentation

## 2016-03-09 DIAGNOSIS — Z79899 Other long term (current) drug therapy: Secondary | ICD-10-CM | POA: Insufficient documentation

## 2016-03-09 DIAGNOSIS — H9202 Otalgia, left ear: Secondary | ICD-10-CM | POA: Diagnosis not present

## 2016-03-09 DIAGNOSIS — Z85828 Personal history of other malignant neoplasm of skin: Secondary | ICD-10-CM | POA: Insufficient documentation

## 2016-03-09 DIAGNOSIS — I10 Essential (primary) hypertension: Secondary | ICD-10-CM | POA: Insufficient documentation

## 2016-03-09 MED ORDER — ONDANSETRON 4 MG PO TBDP
4.0000 mg | ORAL_TABLET | Freq: Once | ORAL | Status: AC
  Administered 2016-03-09: 4 mg via ORAL
  Filled 2016-03-09: qty 1

## 2016-03-09 MED ORDER — HYDROCODONE-ACETAMINOPHEN 5-325 MG PO TABS: 1.0000 | ORAL_TABLET | Freq: Four times a day (QID) | ORAL | 0 refills | Status: AC | PRN

## 2016-03-09 MED ORDER — DEXAMETHASONE 6 MG PO TABS
10.0000 mg | ORAL_TABLET | Freq: Once | ORAL | Status: AC
  Administered 2016-03-09: 10 mg via ORAL
  Filled 2016-03-09: qty 1

## 2016-03-09 MED ORDER — HYDROCODONE-ACETAMINOPHEN 5-325 MG PO TABS
1.0000 | ORAL_TABLET | Freq: Once | ORAL | Status: AC
  Administered 2016-03-09: 1 via ORAL
  Filled 2016-03-09: qty 1

## 2016-03-09 MED ORDER — ONDANSETRON 4 MG PO TBDP: 4.0000 mg | ORAL_TABLET | Freq: Three times a day (TID) | ORAL | 0 refills | Status: AC | PRN

## 2016-03-09 NOTE — Discharge Instructions (Addendum)
Your ear showed no sign of infection currently. I do not feel you need to be on antibiotics. Please call your ENT doctor in the morning for close follow-up as this may be related to your cancer.

## 2016-03-09 NOTE — ED Provider Notes (Signed)
TIME SEEN: 3:40 AM  CHIEF COMPLAINT: Left ear pain  HPI: Patient is a 76 year old male who presents emergency department complaints of left ear pain for the past several days. He is also felt like this ear is "full" and "stopped up". States he does clean his ears with Q-tips. Has had some nausea with the pain. No vertigo or dizziness. 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 Has an area in front of the left ear that is scheduled to be resected on March 9 by Dr. Pablo Ledger with ENT at [facility]. Patient denies any drainage from the see her. No fevers or chills. No cough. No sore throat. Is not on chemotherapy or radiation.  ROS: See HPI Constitutional: no fever  Eyes: no drainage  ENT: no runny nose   Cardiovascular:  no chest pain  Resp: no SOB  GI: no vomiting GU: no dysuria Integumentary: no rash  Allergy: no hives  Musculoskeletal: no leg swelling  Neurological: no slurred speech ROS otherwise negative  PAST MEDICAL HISTORY/PAST SURGICAL HISTORY:  Past Medical History:  Diagnosis Date  . Arthritis   . Cancer ([place])   . Diverticulosis   . Hyperlipidemia   . Hypertension   . Kidney stones   . Osteoarthritis   . Renal disorder   . Skin cancer     MEDICATIONS:  Prior to Admission medications   Medication Sig Start Date End Date Taking? Authorizing Provider  albuterol (PROVENTIL HFA;VENTOLIN HFA) 108 (90 BASE) MCG/ACT inhaler Inhale 2 puffs into the lungs every 4 (four) hours as needed for wheezing or shortness of breath.    Historical Provider, MD  amLODipine (NORVASC) 5 MG tablet Take 5 mg by mouth daily.    Historical Provider, MD  amoxicillin (AMOXIL) 500 MG tablet Take 500 mg by mouth 3 (three) times daily.    Historical Provider, MD  atorvastatin (LIPITOR) 40 MG tablet Take 40 mg by mouth daily.    Historical Provider, MD  buPROPion (WELLBUTRIN) 100 MG tablet Take 30 mg by mouth once.     Historical Provider, MD  carvedilol (COREG) 3.125 MG tablet Take 3.125 mg by mouth 2 (two) times daily with a meal.    Historical Provider, MD  cephALEXin (KEFLEX) 500 MG capsule Take 1 capsule (500 mg total) by mouth 4 (four) times daily. 05/31/15   Ezequiel Essex, MD  cyclobenzaprine (FLEXERIL) 5 MG tablet Take 1 tablet (5 mg total) by mouth 3 (three) times daily as needed for muscle spasms. 01/10/15   Quintella Reichert, MD  Dextromethorphan-Guaifenesin [facility] DM MAXIMUM STRENGTH) 60-1200 MG TB12 Take 1 tablet by mouth 2 (two) times daily as needed (for cough). 02/08/15   John Molpus, MD  doxycycline (VIBRAMYCIN) 100 MG capsule Take 1 capsule (100 mg total) by mouth 2 (two) times daily. One po bid x 7 days 02/08/15   Shanon Rosser, MD  furosemide (LASIX) 40 MG tablet Take 1 tablet (40 mg total) by mouth daily. [id number]   Delora Fuel, MD  HYDROcodone-acetaminophen (NORCO/VICODIN) 5-325 MG tablet Take 1 tablet by mouth every 4 (four) hours as needed. 01/27/16   Isla Pence, MD  lubiprostone (AMITIZA) 8 MCG capsule Take 5 mcg by mouth 2 (two) times daily with a meal.    Historical Provider, MD  nitroGLYCERIN (NITROSTAT) 0.4 MG SL tablet Place 0.4 mg under the tongue every 5 (five) minutes as needed for chest pain.    Historical  Provider, MD  omeprazole (PRILOSEC) 40 MG capsule Take 40 mg by mouth daily.    Historical Provider, MD  ondansetron (ZOFRAN ODT) 8 MG disintegrating tablet Take 1 tablet (8 mg total) by mouth every 8 (eight) hours as needed for nausea or vomiting. 02/06/15   Kandra Nicolas, MD  Promethazine HCl (PHENERGAN PO) Take by mouth.    Historical Provider, MD  tamsulosin (FLOMAX) 0.4 MG CAPS capsule Take 0.4 mg by mouth daily.    Historical Provider, MD    ALLERGIES:  Allergies  Allergen Reactions  . Pneumococcal Vaccines Other (See Comments)    Fever  . Sulfa Antibiotics Nausea Only  . Aspirin Anxiety    SOCIAL HISTORY:  Social History  Substance Use Topics  . Smoking status:  Former Research scientist (life sciences)  . Smokeless tobacco: Never Used  . Alcohol use No    FAMILY HISTORY: No family history on file.  EXAM: BP (!) 148/105 (BP Location: Left Arm)   Pulse 84   Temp 98.2 F (36.8 C) (Oral)   Resp 18   Ht 6\' 1"  (1.854 m)   Wt 185 lb (83.9 kg)   SpO2 100%   BMI 24.41 kg/m  CONSTITUTIONAL: Alert and oriented and responds appropriately to questions. Well-appearing; well-nourished HEAD: Normocephalic EYES: Conjunctivae clear, PERRL, EOMI ENT: normal nose; no rhinorrhea; moist mucous membranes; No pharyngeal erythema or petechiae, no tonsillar hypertrophy or exudate, no uvular deviation, no unilateral swelling, no trismus or drooling, no muffled voice, normal phonation, no stridor, poor dentition noted but no dental pain, no drainable dental abscess noted, no Ludwig's angina, tongue sits flat in the bottom of the mouth, no angioedema, no facial erythema or warmth, no facial swelling; no pain with movement of the neck; TMs are clear bilaterally without erythema, purulence, bulging, perforation, effusion.  No cerumen impaction or sign of foreign body in the external auditory canal. No inflammation, erythema or drainage from the external auditory canal. No signs of mastoiditis. No pain with manipulation of the pinna bilaterally.  The left ear is grossly abnormal appearing for multiple resections with scar tissue. There is a 1 x 2 cm mass in the preauricular area of the left ear that is not fluctuant, indurated, red or warm. NECK: Supple, no meningismus, no nuchal rigidity, no LAD  CARD: RRR; S1 and S2 appreciated; no murmurs, no clicks, no rubs, no gallops RESP: Normal chest excursion without splinting or tachypnea; breath sounds clear and equal bilaterally; no wheezes, no rhonchi, no rales, no hypoxia or respiratory distress, speaking full sentences ABD/GI: Normal bowel sounds; non-distended; soft, non-tender, no rebound, no guarding, no peritoneal signs, no hepatosplenomegaly BACK:  The  back appears normal and is non-tender to palpation, there is no CVA tenderness EXT: Normal ROM in all joints; non-tender to palpation; no edema; normal capillary refill; no cyanosis, no calf tenderness or swelling    SKIN: Normal color for age and race; warm; no rash NEURO: Moves all extremities equally, sensation to light touch intact diffusely, cranial nerves II through XII intact, normal speech PSYCH: The patient's mood and manner are appropriate. Grooming and personal hygiene are appropriate.  MEDICAL DECISION MAKING: Patient here with left-sided ear pain. Has had multiple resections of this area because of basal cell and squamous cell carcinoma. There is no sign of otitis media, otitis externa or mastoiditis on exam. No sign of dental infection. Nothing to suggest deep space neck infection, meningitis. No sign of facial cellulitis, parotitis. No foreign body or cerumen impaction. Discussed with  patient that this could be because of his multiple resections and history of cancer and I have recommended close follow-up with his ENT physician Dr. Pablo Ledger. Wife reports that she plans to call him in the morning. Patient states that he does not feel Tylenol ibuprofen will be enough for him to control his pain. We'll give Vicodin here, Zofran. I do not feel he needs antibiotics at this time. I do not feel he needs emergent imaging. Discussed at length with patient and wife return precautions. They're comfortable with this plan.  At this time, I do not feel there is any life-threatening condition present. I have reviewed and discussed all results (EKG, imaging, lab, urine as appropriate) and exam findings with patient/family. I have reviewed nursing notes and appropriate previous records.  I feel the patient is safe to be discharged home without further emergent workup and can continue workup as an outpatient as needed. Discussed usual and customary return precautions. Patient/family verbalize understanding and  are comfortable with this plan.  Outpatient follow-up has been provided. All questions have been answered.      [place], DO 03/09/16 [phone]

## 2016-03-09 NOTE — ED Triage Notes (Signed)
Pt reports left ear pain since yesterday - 2/20.  Sts it feels "stopped up".  Hearing decreased.  Denies any drainage. Sts he has a lump in front of his ear that he is having removed at [facility] on March 9th. Pt also c/o nausea for a few days but denies Vomiting or diarrhea.

## 2016-04-23 ENCOUNTER — Emergency Department (HOSPITAL_BASED_OUTPATIENT_CLINIC_OR_DEPARTMENT_OTHER): Payer: Medicare Other

## 2016-04-23 ENCOUNTER — Emergency Department (HOSPITAL_BASED_OUTPATIENT_CLINIC_OR_DEPARTMENT_OTHER)
Admission: EM | Admit: 2016-04-23 | Discharge: 2016-04-23 | Disposition: A | Discharge status: Home / Self Care | Payer: Medicare Other | Attending: Emergency Medicine | Admitting: Emergency Medicine

## 2016-04-23 ENCOUNTER — Encounter (HOSPITAL_BASED_OUTPATIENT_CLINIC_OR_DEPARTMENT_OTHER): Payer: Self-pay | Admitting: *Deleted

## 2016-04-23 DIAGNOSIS — Z87891 Personal history of nicotine dependence: Secondary | ICD-10-CM | POA: Diagnosis not present

## 2016-04-23 DIAGNOSIS — K5900 Constipation, unspecified: Secondary | ICD-10-CM | POA: Diagnosis present

## 2016-04-23 DIAGNOSIS — Z79899 Other long term (current) drug therapy: Secondary | ICD-10-CM | POA: Insufficient documentation

## 2016-04-23 DIAGNOSIS — I10 Essential (primary) hypertension: Secondary | ICD-10-CM | POA: Insufficient documentation

## 2016-04-23 MED ORDER — FLEET ENEMA 7-19 GM/118ML RE ENEM
1.0000 | ENEMA | Freq: Once | RECTAL | Status: AC
  Administered 2016-04-23: 1 via RECTAL
  Filled 2016-04-23: qty 1

## 2016-04-23 NOTE — ED Provider Notes (Signed)
[place] DEPT [facility] Provider Note   CSN: [id number] Arrival date & time: 04/23/16  1027     History   Chief Complaint Chief Complaint  Patient presents with  . Constipation    HPI Casey Moreno is a 76 y.o. male.  HPI  Pt presenting with c/o constipation.  He states his last normal bowel movement was one week ago.  He feels that he needs to have a bowel movement but nothing will come out.  He is taking pain medication for a wrist injury.  He started taking miralax yesterday but it has not helped yet.  No fever, no vomiting but does have some nausea and bloating.  No abdominal pain.  No diarrhea.  There are no other associated systemic symptoms, there are no other alleviating or modifying factors.  No dysuria.   Past Medical History:  Diagnosis Date  . Arthritis   . Cancer ([place])   . Diverticulosis   . Hyperlipidemia   . Hypertension   . Kidney stones   . Osteoarthritis   . Renal disorder   . Skin cancer     There are no active problems to display for this patient.   Past Surgical History:  Procedure Laterality Date  . HERNIA REPAIR    . PROSTATE ABLATION    . SKIN BIOPSY    . SKIN GRAFT         Home Medications    Prior to Admission medications   Medication Sig Start Date End Date Taking? Authorizing Provider  albuterol (PROVENTIL HFA;VENTOLIN HFA) 108 (90 BASE) MCG/ACT inhaler Inhale 2 puffs into the lungs every 4 (four) hours as needed for wheezing or shortness of breath.    Historical Provider, MD  amLODipine (NORVASC) 5 MG tablet Take 5 mg by mouth daily.    Historical Provider, MD  atorvastatin (LIPITOR) 40 MG tablet Take 40 mg by mouth daily.    Historical Provider, MD  buPROPion (WELLBUTRIN) 100 MG tablet Take 30 mg by mouth once.    Historical Provider, MD  carvedilol (COREG) 3.125 MG tablet Take 3.125 mg by mouth 2 (two) times daily with a meal.    Historical Provider, MD  furosemide (LASIX) 40 MG tablet Take 1 tablet (40 mg total) by mouth  daily. 02/21/34   Delora Fuel, MD  HYDROcodone-acetaminophen (NORCO/VICODIN) 5-325 MG tablet Take 1-2 tablets by mouth every 6 (six) hours as needed. 03/09/16   Kristen N Ward, DO  lubiprostone (AMITIZA) 8 MCG capsule Take 5 mcg by mouth 2 (two) times daily with a meal.    Historical Provider, MD  nitroGLYCERIN (NITROSTAT) 0.4 MG SL tablet Place 0.4 mg under the tongue every 5 (five) minutes as needed for chest pain.    Historical Provider, MD  omeprazole (PRILOSEC) 40 MG capsule Take 40 mg by mouth daily.    Historical Provider, MD  ondansetron (ZOFRAN ODT) 4 MG disintegrating tablet Take 1 tablet (4 mg total) by mouth every 8 (eight) hours as needed for nausea or vomiting. 03/09/16   Kristen N Ward, DO  Promethazine HCl (PHENERGAN PO) Take by mouth.    Historical Provider, MD  tamsulosin (FLOMAX) 0.4 MG CAPS capsule Take 0.4 mg by mouth daily.    Historical Provider, MD    Family History History reviewed. No pertinent family history.  Social History Social History  Substance Use Topics  . Smoking status: Former Research scientist (life sciences)  . Smokeless tobacco: Never Used  . Alcohol use No     Allergies  Pneumococcal vaccines; Sulfa antibiotics; and Aspirin   Review of Systems Review of Systems  ROS reviewed and all otherwise negative except for mentioned in HPI   Physical Exam Updated Vital Signs BP (!) 165/103 (BP Location: Right Arm)   Pulse 72   Temp 97.9 F (36.6 C) (Oral)   Resp 16   Ht 6\' 1"  (1.854 m)   Wt 186 lb (84.4 kg)   SpO2 100%   BMI 24.54 kg/m  Vitals reviewed Physical Exam  Physical Examination: General appearance - alert, well appearing, and in no distress Mental status - alert, oriented to person, place, and time Eyes - no conjunctival injection, no scleral icterus Chest - clear to auscultation, no wheezes, rales or rhonchi, symmetric air entry Heart - normal rate, regular rhythm, normal S1, S2, no murmurs, rubs, clicks or gallops Abdomen - soft, nontender, nondistended,  no masses or organomegaly, NABS Neurological - alert, oriented, normal speech,  Extremities - peripheral pulses normal, no pedal edema, no clubbing or cyanosis Skin - normal coloration and turgor, no rashes   ED Treatments / Results  Labs (all labs ordered are listed, but only abnormal results are displayed) Labs Reviewed - No data to display  EKG  EKG Interpretation None       Radiology Dg Abdomen 1 View  Result Date: 04/23/2016 CLINICAL DATA:  Abdominal pain, belching and constipation. EXAM: ABDOMEN - 1 VIEW COMPARISON:  CT scan 02/27/2016 FINDINGS: The lung bases are clear. Scattered air and stool throughout colon and down into the rectum may suggest mild constipation. This also scattered air throughout the small bowel but no distention or air-fluid levels to suggest obstruction. No free air. The soft tissue shadows are maintained. The bony structures are unremarkable. IMPRESSION: Moderate stool throughout the colon may suggest constipation. Scattered air throughout the small bowel but no evidence for obstruction. Electronically Signed   By: Marijo Sanes M.D.   On: 04/23/2016 08:13    Procedures Procedures (including critical care time)  Medications Ordered in ED Medications  sodium phosphate (FLEET) 7-19 GM/118ML enema 1 enema (1 enema Rectal Given 04/23/16 6378)     Initial Impression / Assessment and Plan / ED Course  I have reviewed the triage vital signs and the nursing notes.  Pertinent labs & imaging results that were available during my care of the patient were reviewed by me and considered in my medical decision making (see chart for details).   pt has had some stool output after enema.  No signs of obstruction on KUB.   Xray images reviewed and interpreted by me as well.  Pt advised to increase miralax to BID as well.  Discharged with strict return precautions.  Pt agreeable with plan.   Final Clinical Impressions(s) / ED Diagnoses   Final diagnoses:    Constipation, unspecified constipation type    New Prescriptions Discharge Medication List as of 04/23/2016  8:44 AM       Alfonzo Beers, MD 04/23/16 1031

## 2016-04-23 NOTE — ED Triage Notes (Signed)
Pt LNBM over a week ago. Pt denies abd pain began having nausea x 3 days ago

## 2016-04-23 NOTE — ED Notes (Signed)
ED Provider at bedside. 

## 2016-04-23 NOTE — Discharge Instructions (Signed)
Return to the ED with any concerns including vomiting, increased abdominal pain, fever/chills, decreased level of alertness/lethargy, or any other alarming symptoms

## 2016-04-23 NOTE — ED Notes (Signed)
Patient transported to X-ray 

## 2017-05-06 ENCOUNTER — Other Ambulatory Visit: Payer: Self-pay

## 2017-05-06 ENCOUNTER — Emergency Department (HOSPITAL_BASED_OUTPATIENT_CLINIC_OR_DEPARTMENT_OTHER): Payer: Medicare HMO

## 2017-05-06 ENCOUNTER — Emergency Department (HOSPITAL_BASED_OUTPATIENT_CLINIC_OR_DEPARTMENT_OTHER)
Admission: EM | Admit: 2017-05-06 | Discharge: 2017-05-06 | Disposition: A | Discharge status: Home / Self Care | Payer: Medicare HMO | Attending: Emergency Medicine | Admitting: Emergency Medicine

## 2017-05-06 ENCOUNTER — Encounter (HOSPITAL_BASED_OUTPATIENT_CLINIC_OR_DEPARTMENT_OTHER): Payer: Self-pay | Admitting: Emergency Medicine

## 2017-05-06 DIAGNOSIS — Z85828 Personal history of other malignant neoplasm of skin: Secondary | ICD-10-CM | POA: Insufficient documentation

## 2017-05-06 DIAGNOSIS — I10 Essential (primary) hypertension: Secondary | ICD-10-CM | POA: Insufficient documentation

## 2017-05-06 DIAGNOSIS — Z79899 Other long term (current) drug therapy: Secondary | ICD-10-CM | POA: Diagnosis not present

## 2017-05-06 DIAGNOSIS — J449 Chronic obstructive pulmonary disease, unspecified: Secondary | ICD-10-CM | POA: Diagnosis not present

## 2017-05-06 DIAGNOSIS — Z87891 Personal history of nicotine dependence: Secondary | ICD-10-CM | POA: Diagnosis not present

## 2017-05-06 DIAGNOSIS — J209 Acute bronchitis, unspecified: Secondary | ICD-10-CM | POA: Diagnosis not present

## 2017-05-06 DIAGNOSIS — R0602 Shortness of breath: Secondary | ICD-10-CM | POA: Diagnosis present

## 2017-05-06 HISTORY — DX: Chronic obstructive pulmonary disease, unspecified: J44.9

## 2017-05-06 LAB — CBC
HCT: 41.5 % (ref 39.0–52.0)
Hemoglobin: 14.2 g/dL (ref 13.0–17.0)
MCH: 32.4 pg (ref 26.0–34.0)
MCHC: 34.2 g/dL (ref 30.0–36.0)
MCV: 94.7 fL (ref 78.0–100.0)
PLATELETS: 241 10*3/uL (ref 150–400)
RBC: 4.38 MIL/uL (ref 4.22–5.81)
RDW: 13.7 % (ref 11.5–15.5)
WBC: 9.1 10*3/uL (ref 4.0–10.5)

## 2017-05-06 LAB — BASIC METABOLIC PANEL
Anion gap: 9 (ref 5–15)
BUN: 12 mg/dL (ref 6–20)
CHLORIDE: 103 mmol/L (ref 101–111)
CO2: 24 mmol/L (ref 22–32)
CREATININE: 0.97 mg/dL (ref 0.61–1.24)
Calcium: 8.9 mg/dL (ref 8.9–10.3)
GFR calc Af Amer: >60 mL/min (ref >60)
GLUCOSE: 109 mg/dL — AB (ref 65–99)
Potassium: 3.8 mmol/L (ref 3.5–5.1)
SODIUM: 136 mmol/L (ref 135–145)

## 2017-05-06 MED ORDER — IPRATROPIUM BROMIDE 0.02 % IN SOLN
0.5000 mg | Freq: Once | RESPIRATORY_TRACT | Status: AC
  Administered 2017-05-06: 0.5 mg via RESPIRATORY_TRACT
  Filled 2017-05-06: qty 2.5

## 2017-05-06 MED ORDER — PREDNISONE 50 MG PO TABS
60.0000 mg | ORAL_TABLET | Freq: Once | ORAL | Status: AC
  Administered 2017-05-06: 19:00:00 60 mg via ORAL
  Filled 2017-05-06: qty 1

## 2017-05-06 MED ORDER — IPRATROPIUM-ALBUTEROL 0.5-2.5 (3) MG/3ML IN SOLN
RESPIRATORY_TRACT | Status: AC
  Administered 2017-05-06: 3 mL
  Filled 2017-05-06: qty 3

## 2017-05-06 MED ORDER — AZITHROMYCIN 250 MG PO TABS: 250.0000 mg | ORAL_TABLET | Freq: Every day | ORAL | 0 refills | Status: AC

## 2017-05-06 MED ORDER — ALBUTEROL SULFATE (2.5 MG/3ML) 0.083% IN NEBU
5.0000 mg | INHALATION_SOLUTION | Freq: Once | RESPIRATORY_TRACT | Status: AC
  Administered 2017-05-06: 5 mg via RESPIRATORY_TRACT
  Filled 2017-05-06: qty 6

## 2017-05-06 MED ORDER — ALBUTEROL SULFATE (2.5 MG/3ML) 0.083% IN NEBU
INHALATION_SOLUTION | RESPIRATORY_TRACT | Status: AC
  Administered 2017-05-06: 2.5 mg
  Filled 2017-05-06: qty 3

## 2017-05-06 MED ORDER — PREDNISONE 20 MG PO TABS: 60.0000 mg | ORAL_TABLET | Freq: Every day | ORAL | 0 refills | Status: AC

## 2017-05-06 MED ORDER — AZITHROMYCIN 250 MG PO TABS
500.0000 mg | ORAL_TABLET | Freq: Once | ORAL | Status: AC
  Administered 2017-05-06: 500 mg via ORAL
  Filled 2017-05-06: qty 2

## 2017-05-06 NOTE — ED Provider Notes (Signed)
[place] EMERGENCY DEPARTMENT Provider Note   CSN: [id number] Arrival date & time: 05/06/17  1642     History   Chief Complaint Chief Complaint  Patient presents with  . Shortness of Breath  . Cough    HPI Casey Moreno is a 77 y.o. male.  Patient c/o productive cough, greenish sputum, and increased wheezing in the past couple days. Symptoms moderate, persistent, felt worse today. Subjective fever at home. Denies chest pain or discomfort. Mild sob/wheezing. Denies sore throat or runny nose. Hx copd, former smoker. Denies leg pain or swelling. No definite known ill contacts. No nvd.   The history is provided by the patient and the spouse.  Shortness of Breath  Associated symptoms include a fever, cough and wheezing. Pertinent negatives include no headaches, no sore throat, no neck pain, no chest pain, no vomiting, no abdominal pain, no rash and no leg swelling.  Cough  Associated symptoms include shortness of breath and wheezing. Pertinent negatives include no chest pain, no headaches, no sore throat and no eye redness.    Past Medical History:  Diagnosis Date  . Arthritis   . Cancer ([place])   . COPD (chronic obstructive pulmonary disease) ([place])   . Diverticulosis   . Hyperlipidemia   . Hypertension   . Kidney stones   . Osteoarthritis   . Renal disorder   . Skin cancer     There are no active problems to display for this patient.   Past Surgical History:  Procedure Laterality Date  . HERNIA REPAIR    . PROSTATE ABLATION    . SKIN BIOPSY    . SKIN GRAFT          Home Medications    Prior to Admission medications   Medication Sig Start Date End Date Taking? Authorizing Provider  atorvastatin (LIPITOR) 40 MG tablet Take 40 mg by mouth daily.   Yes [provider]  buPROPion (WELLBUTRIN) 100 MG tablet Take 30 mg by mouth once.   Yes [provider]  oxyCODONE-acetaminophen (PERCOCET) 10-325 MG tablet Take 1 tablet by mouth 3  (three) times daily after meals.   Yes [provider]  albuterol (PROVENTIL HFA;VENTOLIN HFA) 108 (90 BASE) MCG/ACT inhaler Inhale 2 puffs into the lungs every 4 (four) hours as needed for wheezing or shortness of breath.    [provider]  amLODipine (NORVASC) 5 MG tablet Take 5 mg by mouth daily.    [provider]  carvedilol (COREG) 3.125 MG tablet Take 3.125 mg by mouth 2 (two) times daily with a meal.    [provider]  furosemide (LASIX) 40 MG tablet Take 1 tablet (40 mg total) by mouth daily. 04/18/68   Delora Fuel, MD  HYDROcodone-acetaminophen (NORCO/VICODIN) 5-325 MG tablet Take 1-2 tablets by mouth every 6 (six) hours as needed. 03/09/16   Ward, Delice Bison, DO  lubiprostone (AMITIZA) 8 MCG capsule Take 5 mcg by mouth 2 (two) times daily with a meal.    [provider]  nitroGLYCERIN (NITROSTAT) 0.4 MG SL tablet Place 0.4 mg under the tongue every 5 (five) minutes as needed for chest pain.    [provider]  omeprazole (PRILOSEC) 40 MG capsule Take 40 mg by mouth daily.    [provider]  ondansetron (ZOFRAN ODT) 4 MG disintegrating tablet Take 1 tablet (4 mg total) by mouth every 8 (eight) hours as needed for nausea or vomiting. 03/09/16   Ward, Delice Bison, DO  Promethazine  HCl (PHENERGAN PO) Take by mouth.    [provider]  tamsulosin (FLOMAX) 0.4 MG CAPS capsule Take 0.4 mg by mouth daily.    [provider]    Family History No family history on file.  Social History Social History   Tobacco Use  . Smoking status: Former Research scientist (life sciences)  . Smokeless tobacco: Never Used  Substance Use Topics  . Alcohol use: No  . Drug use: No     Allergies   Pneumococcal vaccines; Sulfa antibiotics; and Aspirin   Review of Systems Review of Systems  Constitutional: Positive for fever.  HENT: Negative for sore throat.   Eyes: Negative for redness.  Respiratory: Positive for cough, shortness of breath and  wheezing.   Cardiovascular: Negative for chest pain and leg swelling.  Gastrointestinal: Negative for abdominal pain, diarrhea and vomiting.  Genitourinary: Negative for dysuria and flank pain.  Musculoskeletal: Negative for back pain, neck pain and neck stiffness.  Skin: Negative for rash.  Neurological: Negative for headaches.  Hematological: Does not bruise/bleed easily.  Psychiatric/Behavioral: Negative for confusion.     Physical Exam Updated Vital Signs BP (!) 160/102   Pulse (!) 113   Temp 98.6 F (37 C) (Oral)   Resp 20   Ht 1.88 m (6\' 2" )   Wt 90.7 kg (200 lb)   SpO2 96%   BMI 25.68 kg/m   Physical Exam  Constitutional: He appears well-developed and well-nourished. No distress.  HENT:  Mouth/Throat: Oropharynx is clear and moist.  Eyes: Conjunctivae are normal.  Neck: Neck supple. No tracheal deviation present.  Cardiovascular: Normal rate, regular rhythm, normal heart sounds and intact distal pulses. Exam reveals no gallop and no friction rub.  No murmur heard. Pulmonary/Chest: Effort normal and breath sounds normal. No accessory muscle usage. No respiratory distress.  Abdominal: Soft. Bowel sounds are normal. He exhibits no distension. There is no tenderness.  Musculoskeletal: He exhibits no edema or tenderness.  Neurological: He is alert.  Skin: Skin is warm and dry. No rash noted.  Psychiatric: He has a normal mood and affect.  Nursing note and vitals reviewed.    ED Treatments / Results  Labs (all labs ordered are listed, but only abnormal results are displayed) Results for orders placed or performed during the hospital encounter of 05/06/17  CBC  Result Value Ref Range   WBC 9.1 4.0 - 10.5 K/uL   RBC 4.38 4.22 - 5.81 MIL/uL   Hemoglobin 14.2 13.0 - 17.0 g/dL   HCT 41.5 39.0 - 52.0 %   MCV 94.7 78.0 - 100.0 fL   MCH 32.4 26.0 - 34.0 pg   MCHC 34.2 30.0 - 36.0 g/dL   RDW 13.7 11.5 - 15.5 %   Platelets 241 150 - 400 K/uL  Basic metabolic panel    Result Value Ref Range   Sodium 136 135 - 145 mmol/L   Potassium 3.8 3.5 - 5.1 mmol/L   Chloride 103 101 - 111 mmol/L   CO2 24 22 - 32 mmol/L   Glucose, Bld 109 (H) 65 - 99 mg/dL   BUN 12 6 - 20 mg/dL   Creatinine, Ser 0.97 0.61 - 1.24 mg/dL   Calcium 8.9 8.9 - 10.3 mg/dL   GFR calc non Af Amer >60 >60 mL/min   GFR calc Af Amer >60 >60 mL/min   Anion gap 9 5 - 15   Dg Chest 2 View  Result Date: 05/06/2017 CLINICAL DATA:  SOB, cough, x 3 days, diaphoretic  Hx COPD EXAM: CHEST - 2 VIEW COMPARISON:  03/13/2017 FINDINGS: The heart size and mediastinal contours are within normal limits. Both lungs are clear. The visualized skeletal structures are unremarkable. IMPRESSION: No active cardiopulmonary disease. Electronically Signed   By: Nolon Nations M.D.   On: 05/06/2017 18:09    EKG EKG Interpretation  Date/Time:  Saturday May 06 2017 16:54:02 EDT Ventricular Rate:  114 PR Interval:    QRS Duration: 109 QT Interval:  330 QTC Calculation: 455 R Axis:   -51 Text Interpretation:  Sinus tachycardia Premature ventricular complexes Left anterior fascicular block LVH with secondary repolarization abnormality Non-specific ST-t changes Confirmed by Lajean Saver [phone]) on 05/06/2017 5:06:23 PM   Radiology Dg Chest 2 View  Result Date: 05/06/2017 CLINICAL DATA:  SOB, cough, x 3 days, diaphoretic   Hx COPD EXAM: CHEST - 2 VIEW COMPARISON:  03/13/2017 FINDINGS: The heart size and mediastinal contours are within normal limits. Both lungs are clear. The visualized skeletal structures are unremarkable. IMPRESSION: No active cardiopulmonary disease. Electronically Signed   By: Nolon Nations M.D.   On: 05/06/2017 18:09    Procedures Procedures (including critical care time)  Medications Ordered in ED Medications  albuterol (PROVENTIL) (2.5 MG/3ML) 0.083% nebulizer solution (2.5 mg  Given 05/06/17 1702)  ipratropium-albuterol (DUONEB) 0.5-2.5 (3) MG/3ML nebulizer solution (3 mLs  Given  05/06/17 1702)  albuterol (PROVENTIL) (2.5 MG/3ML) 0.083% nebulizer solution 5 mg (5 mg Nebulization Given 05/06/17 1728)  ipratropium (ATROVENT) nebulizer solution 0.5 mg (0.5 mg Nebulization Given 05/06/17 1729)     Initial Impression / Assessment and Plan / ED Course  I have reviewed the triage vital signs and the nursing notes.  Pertinent labs & imaging results that were available during my care of the patient were reviewed by me and considered in my medical decision making (see chart for details).  Labs, ecg, cxr.   Recheck heart rhythm on monitor - pt in sinus rhytm, occ pvc and pac, hr 90s.   Albuterol neb.  Mild wheezing persists.   Prednisone po. Additional alb neb.  Pt indicates feels much improved. Ambulates in ED, no increased wob. No pain.   Pt/spouse indicates he has adequate albuterol at home.  zithromax po.   cxr reviewed - no pna.  Labs reviewed - normal renal fxn,   Pt current appears stable for d/c.     Final Clinical Impressions(s) / ED Diagnoses   Final diagnoses:  None    ED Discharge Orders    None       Lajean Saver, MD 05/06/17 [phone]

## 2017-05-06 NOTE — ED Triage Notes (Signed)
Cough and SOB x 3 days. Pt is diaphoretic. Denies chest pain

## 2017-05-06 NOTE — ED Notes (Signed)
ED Provider at bedside. 

## 2017-05-06 NOTE — Discharge Instructions (Addendum)
It was our pleasure to provide your ER care today - we hope that you feel better.  Take zithromax (antibiotic) as prescribed.  Take prednisone as prescribed.  Use your albuterol treatment every 4-6 hours as need.  Follow up with your doctor in the next few days for recheck if symptoms fail to improve/resolve.  Return to ER if worse, increased trouble breathing, chest pain, weak/fainting, other concern.

## 2017-08-24 IMAGING — DX DG CHEST 2V
2 series · 2 of 2 positions shown · non-contrast
Comparison: Prior chest x-ray 10/24/2014

CLINICAL DATA: 74-year-old male with chest pain in the anterior
epigastric region

EXAM:
CHEST  2 VIEW

[chest pa]
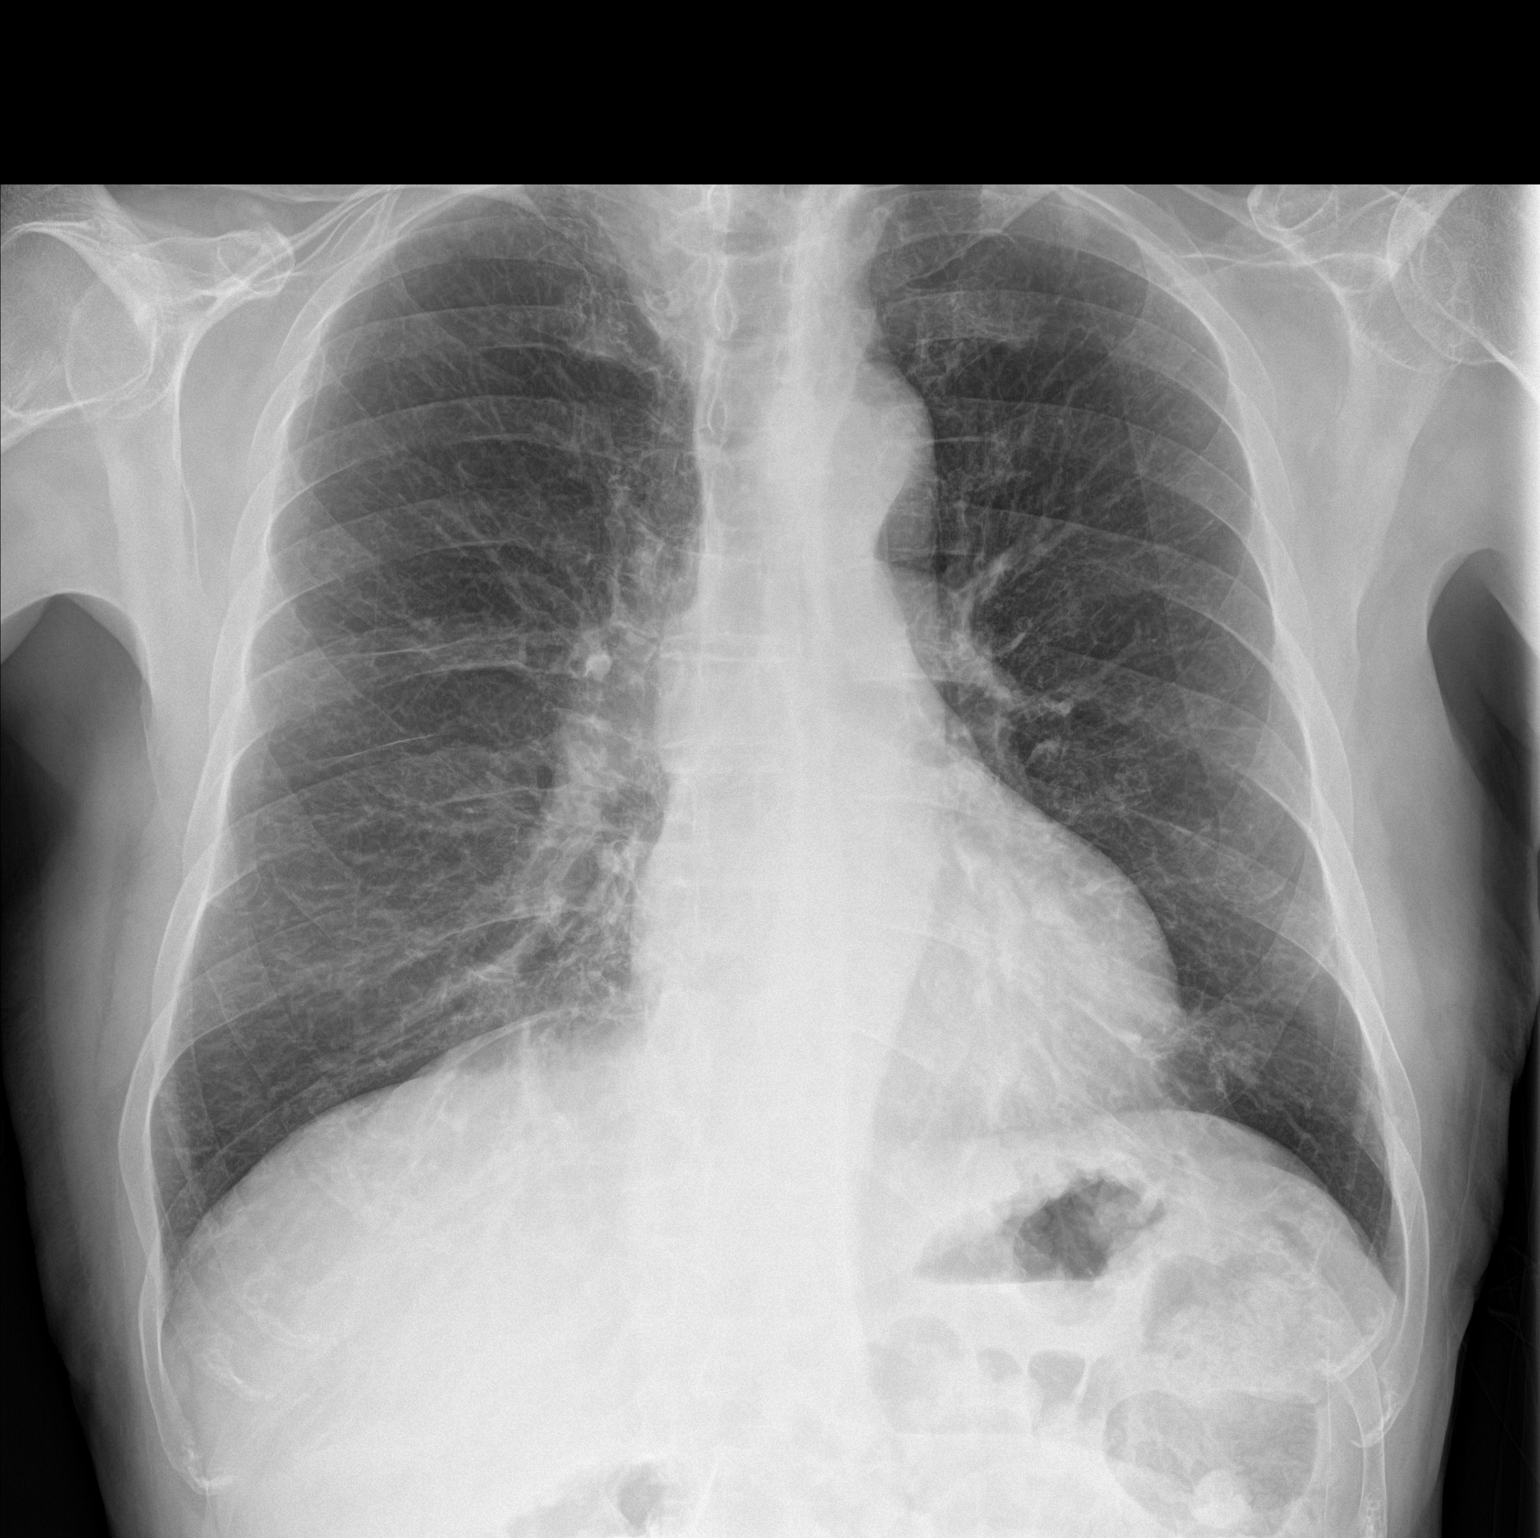

[chest lat]
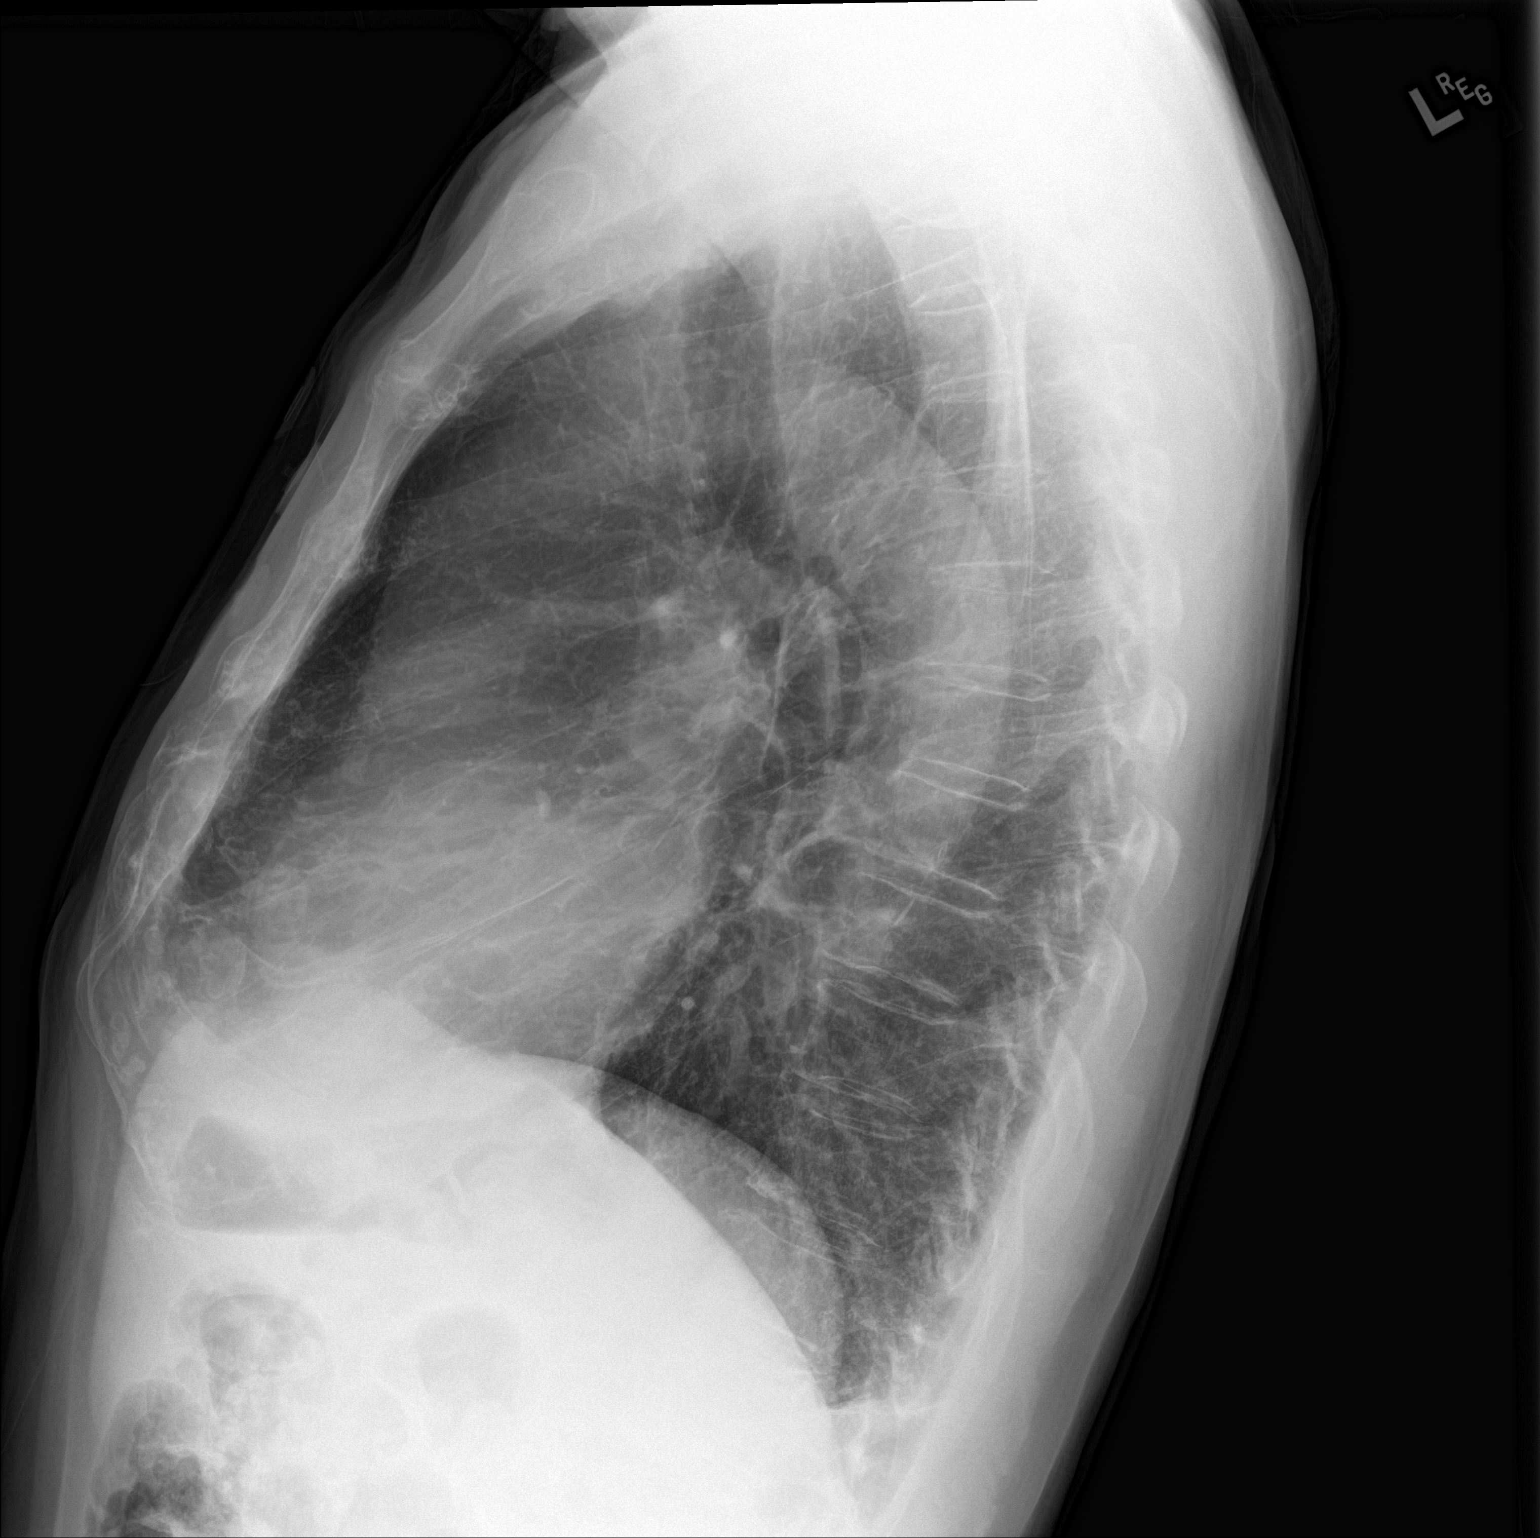

[2 of 2 positions shown; findings below may reference images not displayed]

FINDINGS: Stable cardiac and mediastinal contours. Central bronchitic change
in mild interstitial prominence remain unchanged and chronic in
appearance. No focal airspace consolidation, pleural effusion,
pulmonary edema or pneumothorax. No acute osseous abnormality.
IMPRESSION: Stable chest x-ray without evidence of acute cardiopulmonary
process.

## 2017-08-24 IMAGING — CT CT HEAD W/O CM
1 series · 16 of 30 positions shown, 20 images · non-contrast
Comparison: MR 05/31/2013

CLINICAL DATA: Pt complains of posterior headache x 1 week, denies
injury, denies n/v, denies blurred vision, gait is steady No
previous hx cva,seizure or MI per pt Not photophobic

EXAM:
CT HEAD WITHOUT CONTRAST
TECHNIQUE: Contiguous axial images were obtained from the base of the skull
through the vertex without intravenous contrast.

[Series 2: head 4.8 h37s · axial · 0.49mm/px · z∈[-130,+26]mm · 16 of 36 slices shown, 20 images]
[im 2/36  brain]
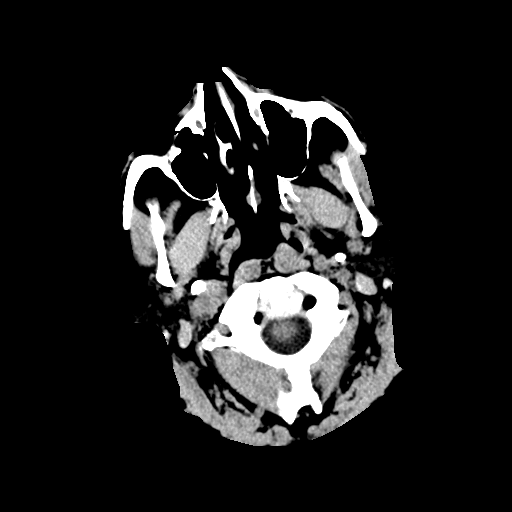
[im 2/36  bone]
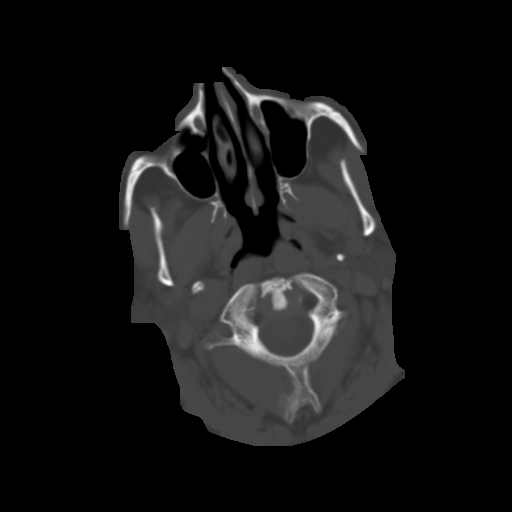
[im 4/36  brain]
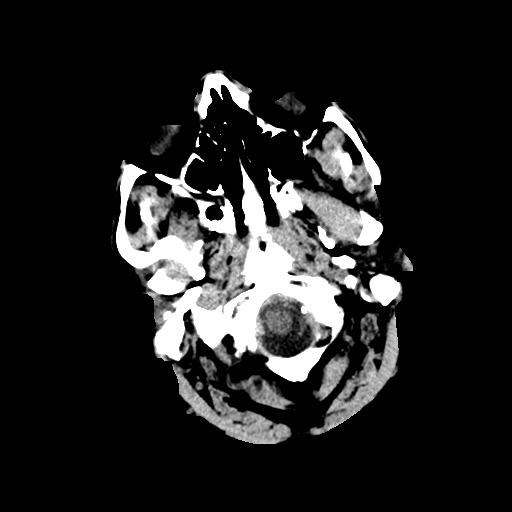
[im 7/36  brain]
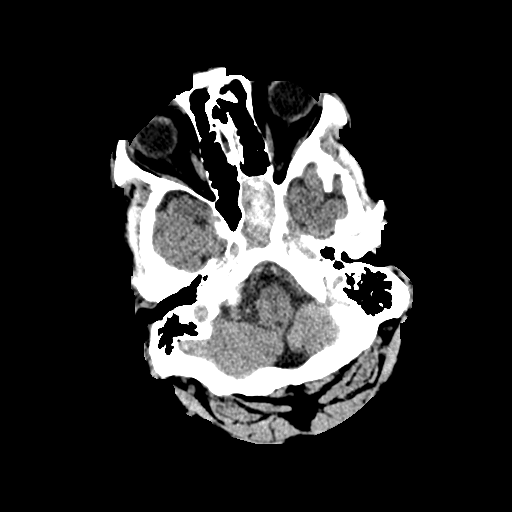
[im 9/36  brain]
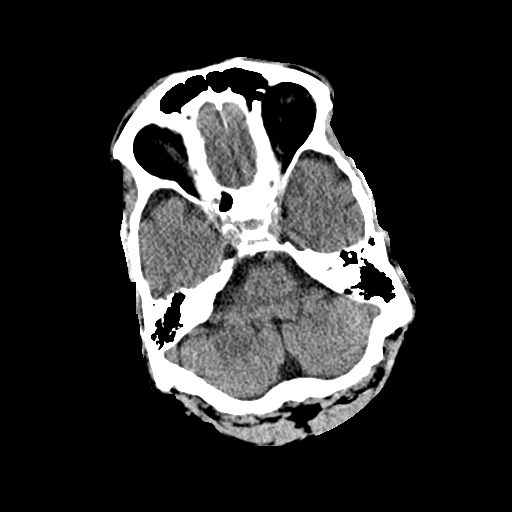
[im 10/36  brain]
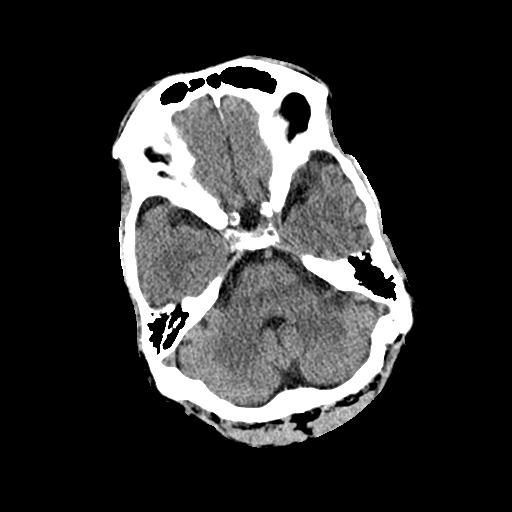
[im 10/36  bone]
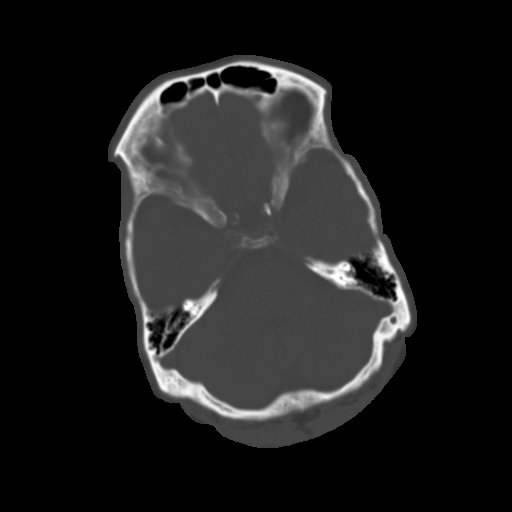
[im 13/36  brain]
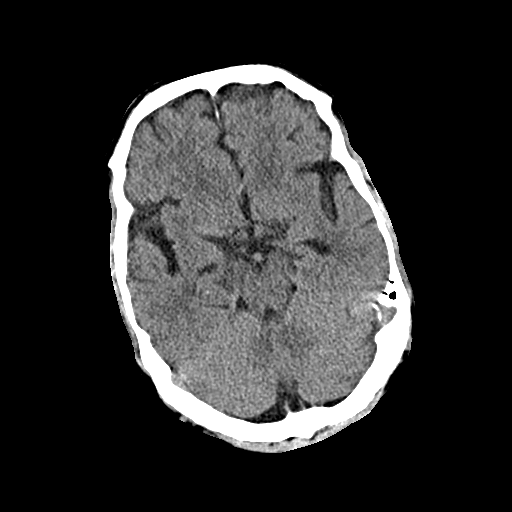
[im 15/36  brain]
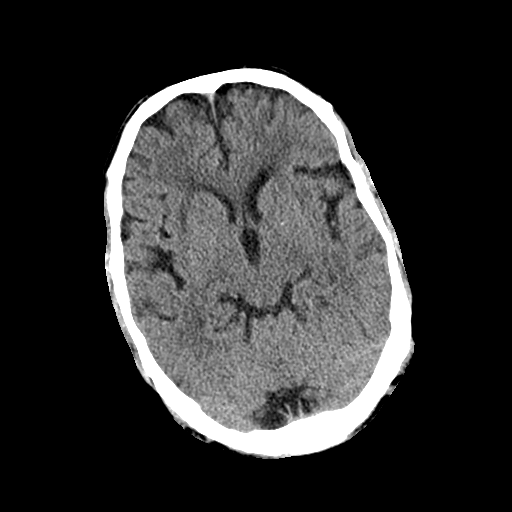
[im 17/36  brain]
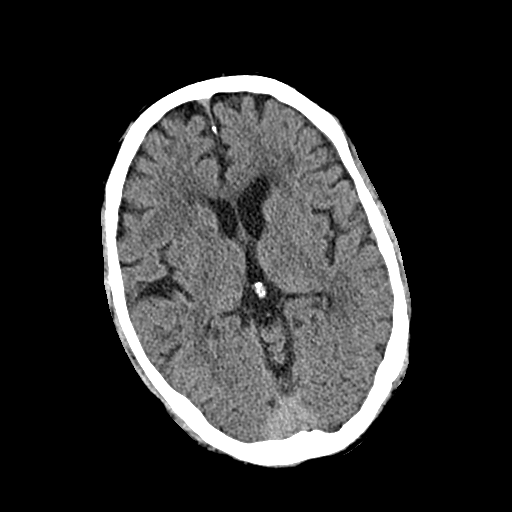
[im 19/36  brain]
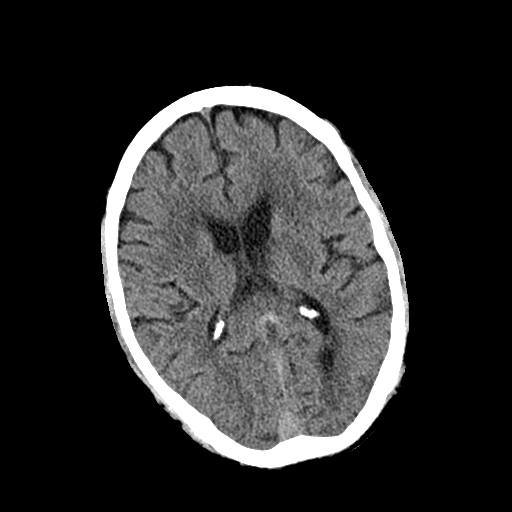
[im 19/36  bone]
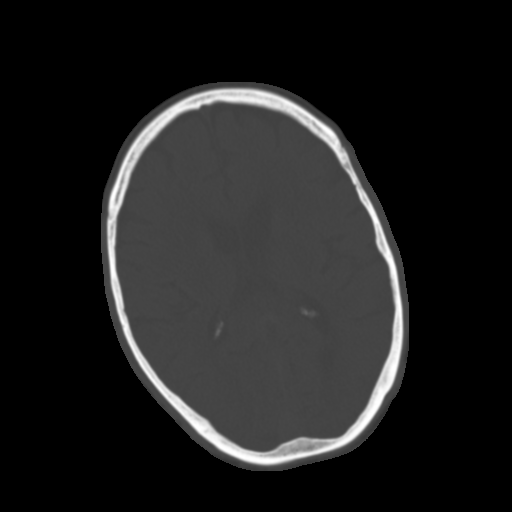
[im 21/36  brain]
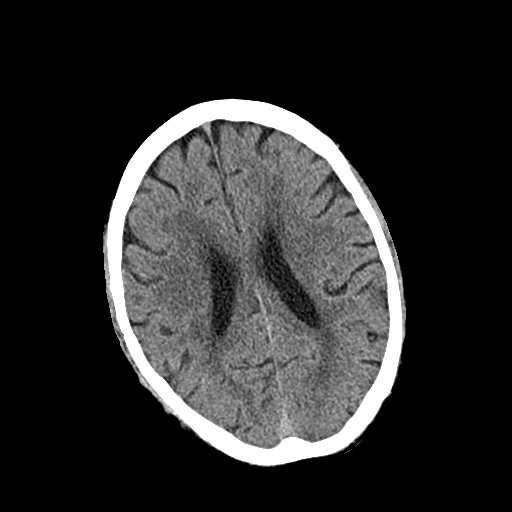
[im 23/36  brain]
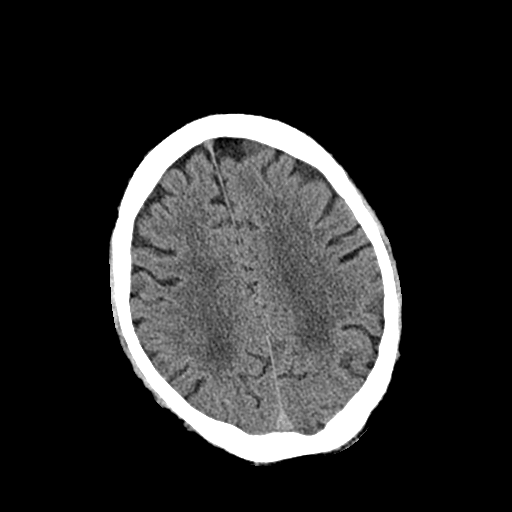
[im 26/36  brain]
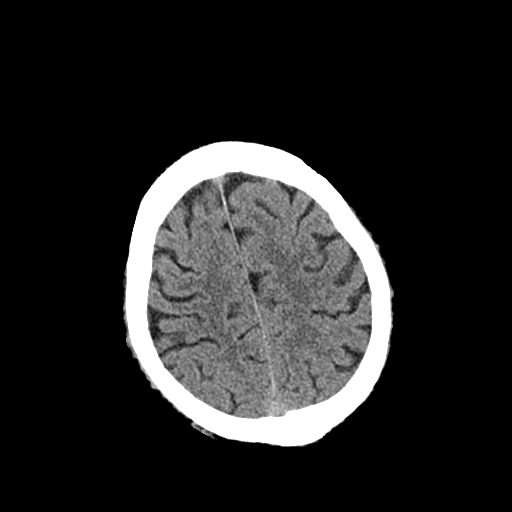
[im 27/36  brain]
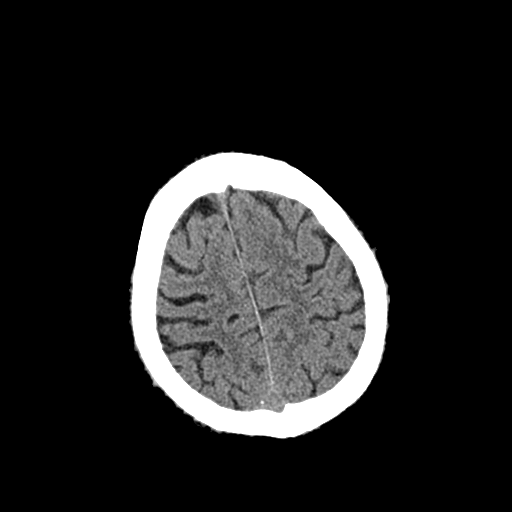
[im 27/36  bone]
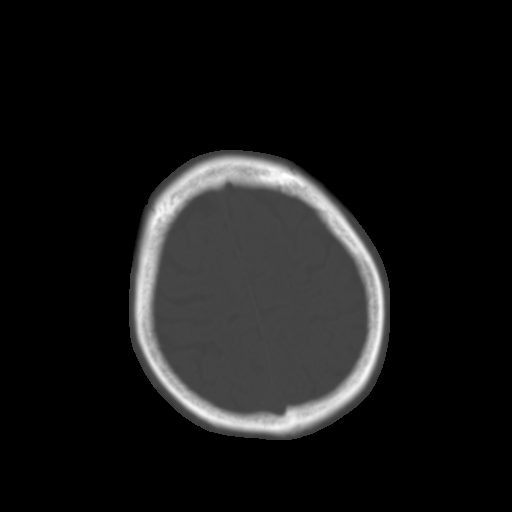
[im 29/36  brain]
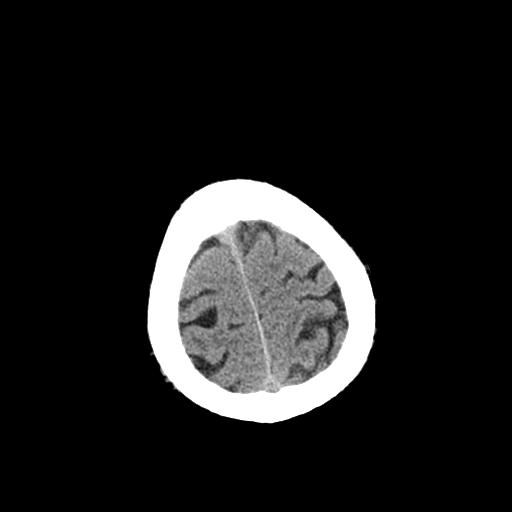
[im 32/36  brain]
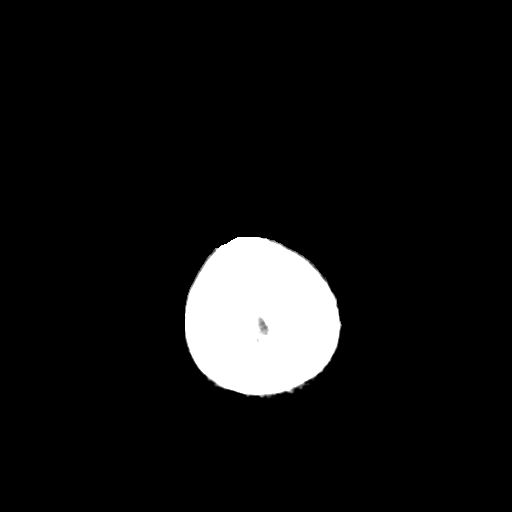
[im 34/36  brain]
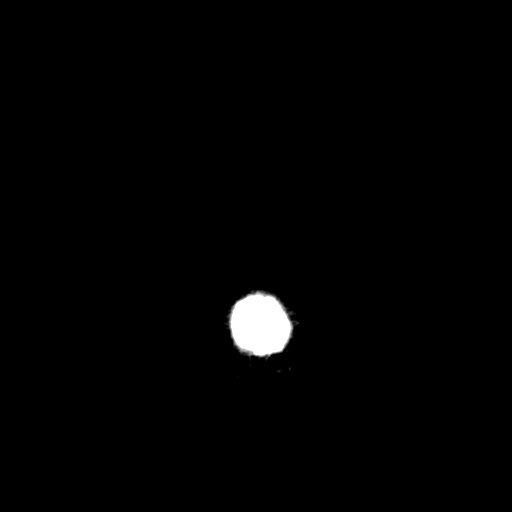

[16 of 30 positions shown; findings below may reference images not displayed]

FINDINGS: Opacification the left sphenoid sinus with some internal high
attenuation and scattered calcifications. Atherosclerotic and
physiologic intracranial calcifications. Mild parenchymal atrophy.
Patchy areas of hypoattenuation in deep and periventricular white
matter bilaterally. Negative for acute intracranial hemorrhage, mass
lesion, acute infarction, midline shift, or mass-effect. Acute
infarct may be inapparent on noncontrast CT. Ventricles and sulci
symmetric. Bone windows demonstrate no focal lesion.
IMPRESSION: 1. Negative for bleed or other acute intracranial process.
2. Atrophy and nonspecific white matter changes.
3. Chronic sphenoid sinus disease.

## 2017-09-10 ENCOUNTER — Other Ambulatory Visit: Payer: Self-pay

## 2017-09-10 ENCOUNTER — Encounter (HOSPITAL_BASED_OUTPATIENT_CLINIC_OR_DEPARTMENT_OTHER): Payer: Self-pay | Admitting: *Deleted

## 2017-09-10 ENCOUNTER — Emergency Department (HOSPITAL_BASED_OUTPATIENT_CLINIC_OR_DEPARTMENT_OTHER): Payer: Medicare HMO

## 2017-09-10 ENCOUNTER — Emergency Department (HOSPITAL_BASED_OUTPATIENT_CLINIC_OR_DEPARTMENT_OTHER)
Admission: EM | Admit: 2017-09-10 | Discharge: 2017-09-10 | Disposition: A | Discharge status: Home / Self Care | Payer: Medicare HMO | Attending: Emergency Medicine | Admitting: Emergency Medicine

## 2017-09-10 DIAGNOSIS — Z8639 Personal history of other endocrine, nutritional and metabolic disease: Secondary | ICD-10-CM | POA: Insufficient documentation

## 2017-09-10 DIAGNOSIS — I10 Essential (primary) hypertension: Secondary | ICD-10-CM | POA: Diagnosis not present

## 2017-09-10 DIAGNOSIS — Z859 Personal history of malignant neoplasm, unspecified: Secondary | ICD-10-CM | POA: Diagnosis not present

## 2017-09-10 DIAGNOSIS — J449 Chronic obstructive pulmonary disease, unspecified: Secondary | ICD-10-CM | POA: Insufficient documentation

## 2017-09-10 DIAGNOSIS — R2689 Other abnormalities of gait and mobility: Secondary | ICD-10-CM | POA: Diagnosis not present

## 2017-09-10 DIAGNOSIS — Z85828 Personal history of other malignant neoplasm of skin: Secondary | ICD-10-CM | POA: Insufficient documentation

## 2017-09-10 DIAGNOSIS — R2681 Unsteadiness on feet: Secondary | ICD-10-CM | POA: Diagnosis present

## 2017-09-10 DIAGNOSIS — Z79899 Other long term (current) drug therapy: Secondary | ICD-10-CM | POA: Diagnosis not present

## 2017-09-10 DIAGNOSIS — R296 Repeated falls: Secondary | ICD-10-CM | POA: Diagnosis not present

## 2017-09-10 LAB — BASIC METABOLIC PANEL
Anion gap: 6 (ref 5–15)
BUN: 18 mg/dL (ref 8–23)
CHLORIDE: 104 mmol/L (ref 98–111)
CO2: 28 mmol/L (ref 22–32)
Calcium: 8.6 mg/dL — ABNORMAL LOW (ref 8.9–10.3)
Creatinine, Ser: 1.25 mg/dL — ABNORMAL HIGH (ref 0.61–1.24)
GFR calc Af Amer: >60 mL/min (ref >60)
GFR calc non Af Amer: 54 mL/min — ABNORMAL LOW (ref >60)
GLUCOSE: 92 mg/dL (ref 70–99)
POTASSIUM: 3.7 mmol/L (ref 3.5–5.1)
SODIUM: 138 mmol/L (ref 135–145)

## 2017-09-10 LAB — CBC
HEMATOCRIT: 38.9 % — AB (ref 39.0–52.0)
Hemoglobin: 13.2 g/dL (ref 13.0–17.0)
MCH: 32.8 pg (ref 26.0–34.0)
MCHC: 33.9 g/dL (ref 30.0–36.0)
MCV: 96.8 fL (ref 78.0–100.0)
PLATELETS: 211 10*3/uL (ref 150–400)
RBC: 4.02 MIL/uL — ABNORMAL LOW (ref 4.22–5.81)
RDW: 12.8 % (ref 11.5–15.5)
WBC: 6.3 10*3/uL (ref 4.0–10.5)

## 2017-09-10 LAB — URINALYSIS, ROUTINE W REFLEX MICROSCOPIC
Bilirubin Urine: NEGATIVE
GLUCOSE, UA: NEGATIVE mg/dL
HGB URINE DIPSTICK: NEGATIVE
Ketones, ur: NEGATIVE mg/dL
Leukocytes, UA: NEGATIVE
Nitrite: NEGATIVE
PROTEIN: NEGATIVE mg/dL
SPECIFIC GRAVITY, URINE: 1.025 (ref 1.005–1.030)
pH: 6 (ref 5.0–8.0)

## 2017-09-10 LAB — RAPID URINE DRUG SCREEN, HOSP PERFORMED
AMPHETAMINES: POSITIVE — AB
BENZODIAZEPINES: NOT DETECTED
Barbiturates: NOT DETECTED
Cocaine: NOT DETECTED
OPIATES: NOT DETECTED
TETRAHYDROCANNABINOL: NOT DETECTED

## 2017-09-10 LAB — HEPATIC FUNCTION PANEL
ALT: 15 U/L (ref 0–44)
AST: 27 U/L (ref 15–41)
Albumin: 3.7 g/dL (ref 3.5–5.0)
Alkaline Phosphatase: 54 U/L (ref 38–126)
Bilirubin, Direct: 0.2 mg/dL (ref 0.0–0.2)
Indirect Bilirubin: 0.6 mg/dL (ref 0.3–0.9)
Total Bilirubin: 0.8 mg/dL (ref 0.3–1.2)
Total Protein: 6.7 g/dL (ref 6.5–8.1)

## 2017-09-10 LAB — PHOSPHORUS: Phosphorus: 3.9 mg/dL (ref 2.5–4.6)

## 2017-09-10 LAB — PROTIME-INR
INR: 1.29
Prothrombin Time: 16 seconds — ABNORMAL HIGH (ref 11.4–15.2)

## 2017-09-10 LAB — ETHANOL

## 2017-09-10 LAB — MAGNESIUM: Magnesium: 2.2 mg/dL (ref 1.7–2.4)

## 2017-09-10 NOTE — ED Provider Notes (Signed)
[facility] Provider Note   CSN: [id number] Arrival date & time: 09/10/17  2043     History   Chief Complaint Chief Complaint  Patient presents with  . Fall    HPI Casey Moreno is a 77 y.o. male.  HPI Patient reports that he has fallen 5 times today.  He reports that he just cannot stay upright.  He states that his legs get weak and buckle and then he falls.  When he tries to get back up he cannot get back up by himself.  His wife reports that his gait has been kind of small shuffling steps.  It is hard to determine really how long this is been going on.  Patient states that he was normal 2 weeks ago.  He describes falls intermittently since then.  He reports he fell yesterday.  He does not lose consciousness.  He reports he has some pain in his back from his fall.  He indicates his lower right lumbar area.  Patient is equivocal about lower extremity numbness.  He indicates that sometimes his legs seem numb and tingly but that is mostly at nighttime.  He does take Eliquis.  She denies alcohol use.  He reports he used to drink heavily but not for greater than 20 years.  Reports his hands feel tingly sometimes but he is not having weakness of the upper extremities.  On review of systems he does endorse burning with urination.  That is just recently occurred.  He denies retention or incontinence.  He also endorses cough but that was about 2 weeks ago.  He reports he was treated and symptoms did improve.  Ports he was very sick at that time. Past Medical History:  Diagnosis Date  . Arthritis   . Cancer ([place])   . COPD (chronic obstructive pulmonary disease) ([place])   . Diverticulosis   . Hyperlipidemia   . Hypertension   . Kidney stones   . Osteoarthritis   . Renal disorder   . Skin cancer     There are no active problems to display for this patient.   Past Surgical History:  Procedure Laterality Date  . HERNIA REPAIR    . PROSTATE ABLATION    .  SKIN BIOPSY    . SKIN GRAFT          Home Medications    Prior to Admission medications   Medication Sig Start Date End Date Taking? Authorizing Provider  albuterol (PROVENTIL HFA;VENTOLIN HFA) 108 (90 BASE) MCG/ACT inhaler Inhale 2 puffs into the lungs every 4 (four) hours as needed for wheezing or shortness of breath.    [provider]  amLODipine (NORVASC) 5 MG tablet Take 5 mg by mouth daily.    [provider]  atorvastatin (LIPITOR) 40 MG tablet Take 40 mg by mouth daily.    [provider]  azithromycin (ZITHROMAX Z-PAK) 250 MG tablet Take 1 tablet (250 mg total) by mouth daily. Take as directed 05/07/17   Lajean Saver, MD  buPROPion (WELLBUTRIN) 100 MG tablet Take 30 mg by mouth once.    [provider]  carvedilol (COREG) 3.125 MG tablet Take 3.125 mg by mouth 2 (two) times daily with a meal.    [provider]  furosemide (LASIX) 40 MG tablet Take 1 tablet (40 mg total) by mouth daily. 04/23/40   Delora Fuel, MD  HYDROcodone-acetaminophen (NORCO/VICODIN) 5-325 MG tablet Take 1-2 tablets by mouth every 6 (six) hours as needed.  03/09/16   Ward, Delice Bison, DO  lubiprostone (AMITIZA) 8 MCG capsule Take 5 mcg by mouth 2 (two) times daily with a meal.    [provider]  nitroGLYCERIN (NITROSTAT) 0.4 MG SL tablet Place 0.4 mg under the tongue every 5 (five) minutes as needed for chest pain.    [provider]  omeprazole (PRILOSEC) 40 MG capsule Take 40 mg by mouth daily.    [provider]  ondansetron (ZOFRAN ODT) 4 MG disintegrating tablet Take 1 tablet (4 mg total) by mouth every 8 (eight) hours as needed for nausea or vomiting. 03/09/16   Ward, Delice Bison, DO  oxyCODONE-acetaminophen (PERCOCET) 10-325 MG tablet Take 1 tablet by mouth 3 (three) times daily after meals.    [provider]  predniSONE (DELTASONE) 20 MG tablet Take 3 tablets (60 mg total) by mouth daily. 05/07/17   Lajean Saver, MD    Promethazine HCl (PHENERGAN PO) Take by mouth.    [provider]  tamsulosin (FLOMAX) 0.4 MG CAPS capsule Take 0.4 mg by mouth daily.    [provider]    Family History No family history on file.  Social History Social History   Tobacco Use  . Smoking status: Never Smoker  . Smokeless tobacco: Never Used  Substance Use Topics  . Alcohol use: Not Currently  . Drug use: No     Allergies   Pneumococcal vaccines; Sulfa antibiotics; and Aspirin   Review of Systems Review of Systems 10 Systems reviewed and are negative for acute change except as noted in the HPI.   Physical Exam Updated Vital Signs BP (!) 142/85   Pulse 81   Temp 98.1 F (36.7 C) (Oral)   Resp 16   Ht 6\' 2"  (1.88 m)   Wt 90.7 kg   SpO2 97%   BMI 25.68 kg/m   Physical Exam  Constitutional:  Patient is alert and interactive.  Is slightly disheveled and poorly groomed.  He is cheerful.  He does not appear to be in any pain.  No respiratory distress.  HENT:  Head: Normocephalic and atraumatic.  Mouth/Throat: Oropharynx is clear and moist.  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  These are on the left side of the face and distort the pinna of the ear, it appears the portion of the jaw has been resected.  All of this is very well-healed without any edema or erythema.  Oral cavity is patent.  Patient has poor dentition.  Left TM is normal.  Right ear canal is obstructed by a deep impaction of cerumen.  Eyes: Pupils are equal, round, and reactive to light. EOM are normal.  Cardiovascular:  Is regular with occasional ectopic beats.  No gross rub murmur gallop.  Pulmonary/Chest: Effort normal and breath sounds normal.  Abdominal: Soft. He exhibits no distension. There is no tenderness. There is no guarding.  Musculoskeletal: Normal range of motion. He exhibits no edema or tenderness.  Lower extremities are in good  condition.  Musculature does not appear to have atrophy.  Knees are normal without significant arthritic changes or effusion.  Calves are soft and nontender.  Condition of feet is very good.  No wounds.  Straight leg raise does not elicit pain.  I can put the patient to range of motion with flexion at the knees and the hips without pain.  Neurological:  Patient is alert and oriented.  No evidence of a  aphasia.  Uses both upper extremities at will.  He was slow to learn alternating finger-nose exam.  With some practice however he could perform.  Grip strength symmetric.  He is able to hold each lower extremity off the bed.  Good dorsiflexion.  Skin: Skin is warm and dry.  Psychiatric: He has a normal mood and affect.     ED Treatments / Results  Labs (all labs ordered are listed, but only abnormal results are displayed) Labs Reviewed  BASIC METABOLIC PANEL - Abnormal; Notable for the following components:      Result Value   Creatinine, Ser 1.25 (*)    Calcium 8.6 (*)    GFR calc non Af Amer 54 (*)    All other components within normal limits  CBC - Abnormal; Notable for the following components:   RBC 4.02 (*)    HCT 38.9 (*)    All other components within normal limits  PROTIME-INR - Abnormal; Notable for the following components:   Prothrombin Time 16.0 (*)    All other components within normal limits  RAPID URINE DRUG SCREEN, HOSP PERFORMED - Abnormal; Notable for the following components:   Amphetamines POSITIVE (*)    All other components within normal limits  URINALYSIS, ROUTINE W REFLEX MICROSCOPIC  ETHANOL  HEPATIC FUNCTION PANEL  MAGNESIUM  PHOSPHORUS  TSH    EKG EKG Interpretation  Date/Time:  Sunday September 10 2017 22:47:42 EDT Ventricular Rate:  77 PR Interval:    QRS Duration: 115 QT Interval:  427 QTC Calculation: 484 R Axis:   -46 Text Interpretation:  Sinus rhythm Prolonged PR interval LVH with IVCD, LAD and secondary repol abnrm Borderline prolonged QT  interval no change from old Confirmed by Charlesetta Shanks [phone]) on 09/10/2017 11:30:38 PM   Radiology Dg Chest 2 View  Result Date: 09/10/2017 CLINICAL DATA:  Cough EXAM: CHEST - 2 VIEW COMPARISON:  08/29/2017 FINDINGS: Cardiomegaly. Right base atelectasis. Left lung clear. No effusions or acute bony abnormality. IMPRESSION: Cardiomegaly.  Right base atelectasis. Electronically Signed   By: Rolm Baptise M.D.   On: 09/10/2017 22:32   Ct Head Wo Contrast  Result Date: 09/10/2017 CLINICAL DATA:  Fall 5 times today, injury to back of head on last fall. History of mini strokes. EXAM: CT HEAD WITHOUT CONTRAST TECHNIQUE: Contiguous axial images were obtained from the base of the skull through the vertex without intravenous contrast. COMPARISON:  Head CT dated 05/22/2017. FINDINGS: Brain: Again noted is generalized mild age related volume loss with commensurate dilatation of the sulci. Ventricles are stable in size and configuration. Chronic small vessel ischemic changes again noted within the bilateral periventricular and subcortical white matter regions. There is no mass, hemorrhage, edema or other evidence of acute parenchymal abnormality. No extra-axial hemorrhage. Vascular: Chronic calcified atherosclerotic changes of the large vessels at the skull base. No unexpected hyperdense vessel. Skull: Normal. Negative for fracture or focal lesion. Sinuses/Orbits: No acute findings. Chronic appearing sphenoid sinus disease. Other: None.  No scalp hematoma identified. IMPRESSION: 1. No acute intracranial abnormality. No intracranial mass, hemorrhage or edema. No skull fracture. 2. Chronic small vessel ischemic changes within the white matter. 3. Chronic sphenoid sinus disease. Electronically Signed   By: Franki Cabot M.D.   On: 09/10/2017 22:58    Procedures Procedures (including critical care time)  Medications Ordered in ED Medications - No data to display   Initial Impression / Assessment and Plan / ED  Course  I have reviewed the triage vital signs  and the nursing notes.  Pertinent labs & imaging results that were available during my care of the patient were reviewed by me and considered in my medical decision making (see chart for details).    After completing evaluation, patient was ambulated.  He can stand and ambulate.  He has a slightly irregular gait with some shuffling but bears weight and does not require full assistance.  Patient's wife advised me that their doctor has checked "everything" try to figure out why the patient is having gait dysfunction.  She reports that they are supposed to be getting home health aide.  EMR indicates that patient has, for some reason missed multiple scheduled MRI\MRA's.  Per PCP note, he is scheduled to see neurology.  Patient appears to have had long-standing problems with gait instability and falls.  Acutely, there do not appear to be any immediate injuries.  CT head done due to report of falls with hitting his head and taking Eliquis.  No acute intracranial findings or bleeding.  Physical exam I do not suspect any acute fractures.  She has normal range of motion of all extremities.  No significant contusions or abrasions.  Actually, patient has good muscular development of the lower extremities.  He does have some arthritic changes of the knees but no effusions.  At this time, etiology for frequent falls is unclear.  However appears to be chronic and stable with in place outpatient care for assistance both at home and for further diagnostic evaluation.  Final Clinical Impressions(s) / ED Diagnoses   Final diagnoses:  Frequent falls  Gait instability    ED Discharge Orders    None       Charlesetta Shanks, MD 09/11/17 0040

## 2017-09-10 NOTE — Discharge Instructions (Addendum)
1.  Call your doctor in the morning and let them know you are seen in the emergency department for repeat falls.  Try to schedule an office visit as soon as possible. 2.  Do not walk by yourself.  Make sure you always have something to hold onto.  You may need a walker or a cane for safety.

## 2017-09-10 NOTE — ED Triage Notes (Signed)
Pt reports he fell 5 times today. States "I can't stay on my feet right". Reports the last time he fell he hit his right arm on a hot water heater. He has an abrasion above his right elbow. States he hit his head also. Pt says he is not sure why he fell

## 2017-09-10 NOTE — ED Notes (Signed)
Pt's wife states he was seen recently by his PCP who was going to send someone to evaluate him for a walker? due to mobility problems

## 2017-09-11 LAB — TSH: TSH: 3.196 u[IU]/mL (ref 0.350–4.500)

## 2017-11-01 ENCOUNTER — Emergency Department (HOSPITAL_BASED_OUTPATIENT_CLINIC_OR_DEPARTMENT_OTHER): Payer: Medicare HMO

## 2017-11-01 ENCOUNTER — Emergency Department (HOSPITAL_BASED_OUTPATIENT_CLINIC_OR_DEPARTMENT_OTHER)
Admission: EM | Admit: 2017-11-01 | Discharge: 2017-11-01 | Disposition: A | Discharge status: Home / Self Care | Payer: Medicare HMO | Attending: Emergency Medicine | Admitting: Emergency Medicine

## 2017-11-01 ENCOUNTER — Encounter (HOSPITAL_BASED_OUTPATIENT_CLINIC_OR_DEPARTMENT_OTHER): Payer: Self-pay | Admitting: Emergency Medicine

## 2017-11-01 ENCOUNTER — Other Ambulatory Visit: Payer: Self-pay

## 2017-11-01 DIAGNOSIS — Z79899 Other long term (current) drug therapy: Secondary | ICD-10-CM | POA: Diagnosis not present

## 2017-11-01 DIAGNOSIS — I10 Essential (primary) hypertension: Secondary | ICD-10-CM | POA: Insufficient documentation

## 2017-11-01 DIAGNOSIS — K5900 Constipation, unspecified: Secondary | ICD-10-CM

## 2017-11-01 DIAGNOSIS — R0789 Other chest pain: Secondary | ICD-10-CM | POA: Diagnosis not present

## 2017-11-01 DIAGNOSIS — J449 Chronic obstructive pulmonary disease, unspecified: Secondary | ICD-10-CM | POA: Insufficient documentation

## 2017-11-01 DIAGNOSIS — Z859 Personal history of malignant neoplasm, unspecified: Secondary | ICD-10-CM | POA: Insufficient documentation

## 2017-11-01 LAB — CBC WITH DIFFERENTIAL/PLATELET
ABS IMMATURE GRANULOCYTES: 0.04 10*3/uL (ref 0.00–0.07)
BASOS ABS: 0 10*3/uL (ref 0.0–0.1)
Basophils Relative: 1 %
Eosinophils Absolute: 0.2 10*3/uL (ref 0.0–0.5)
Eosinophils Relative: 3 %
HCT: 39 % (ref 39.0–52.0)
HEMOGLOBIN: 12.9 g/dL — AB (ref 13.0–17.0)
IMMATURE GRANULOCYTES: 1 %
LYMPHS PCT: 36 %
Lymphs Abs: 2 10*3/uL (ref 0.7–4.0)
MCH: 32 pg (ref 26.0–34.0)
MCHC: 33.1 g/dL (ref 30.0–36.0)
MCV: 96.8 fL (ref 80.0–100.0)
MONO ABS: 0.7 10*3/uL (ref 0.1–1.0)
Monocytes Relative: 12 %
NEUTROS ABS: 2.7 10*3/uL (ref 1.7–7.7)
NEUTROS PCT: 47 %
PLATELETS: 246 10*3/uL (ref 150–400)
RBC: 4.03 MIL/uL — ABNORMAL LOW (ref 4.22–5.81)
RDW: 13 % (ref 11.5–15.5)
WBC: 5.6 10*3/uL (ref 4.0–10.5)
nRBC: 0 % (ref 0.0–0.2)

## 2017-11-01 LAB — BASIC METABOLIC PANEL
ANION GAP: 9 (ref 5–15)
BUN: 12 mg/dL (ref 8–23)
CHLORIDE: 105 mmol/L (ref 98–111)
CO2: 25 mmol/L (ref 22–32)
Calcium: 8.8 mg/dL — ABNORMAL LOW (ref 8.9–10.3)
Creatinine, Ser: 1.08 mg/dL (ref 0.61–1.24)
Glucose, Bld: 103 mg/dL — ABNORMAL HIGH (ref 70–99)
POTASSIUM: 3.2 mmol/L — AB (ref 3.5–5.1)
SODIUM: 139 mmol/L (ref 135–145)

## 2017-11-01 LAB — TROPONIN I

## 2017-11-01 MED ORDER — FLEET ENEMA 7-19 GM/118ML RE ENEM
1.0000 | ENEMA | Freq: Once | RECTAL | Status: AC
  Administered 2017-11-01: 1 via RECTAL
  Filled 2017-11-01: qty 1

## 2017-11-01 MED ORDER — POTASSIUM CHLORIDE CRYS ER 20 MEQ PO TBCR
40.0000 meq | EXTENDED_RELEASE_TABLET | Freq: Once | ORAL | Status: AC
  Administered 2017-11-01: 40 meq via ORAL
  Filled 2017-11-01: qty 2

## 2017-11-01 NOTE — ED Provider Notes (Signed)
[facility] Provider Note: Georgena Spurling, MD, FACEP  CSN: [id number] MRN: [id number] ARRIVAL: 11/01/17 at [place]: [place]  Chest Pain and Constipation   HISTORY OF PRESENT ILLNESS  11/01/17 5:45 AM Casey Moreno is a 77 y.o. male recently treated with doxycycline for urinary tract infection.  He is also been constipated for the past 5 days and has not gotten relief with MiraLAX.  He does feel like his abdomen is tight.  He is able to pass gas and to burp.  A CT stone study, obtained for hematuria on October 25, 2017, was negative for acute process.   He is here with chest pain that began about 2 hours ago.  He describes the pain as dull, well localized and to the right of his sternum.  It is moderate in intensity and worse with palpation or movement of his right arm.  He states it feels like the pain is coming from down in his abdomen.  There is no associated shortness of breath.  He denies fever, chills, nausea, vomiting or diaphoresis.    Past Medical History:  Diagnosis Date  . Arthritis   . Cancer ([place])   . COPD (chronic obstructive pulmonary disease) ([place])   . Diverticulosis   . Hyperlipidemia   . Hypertension   . Kidney stones   . Osteoarthritis   . Renal disorder   . Skin cancer     Past Surgical History:  Procedure Laterality Date  . HERNIA REPAIR    . PROSTATE ABLATION    . SKIN BIOPSY    . SKIN GRAFT      Family History  Problem Relation Age of Onset  . Asthma Mother     Social History   Tobacco Use  . Smoking status: Never Smoker  . Smokeless tobacco: Never Used  Substance Use Topics  . Alcohol use: Not Currently  . Drug use: No    Prior to Admission medications   Medication Sig Start Date End Date Taking? Authorizing Provider  albuterol (PROVENTIL HFA;VENTOLIN HFA) 108 (90 BASE) MCG/ACT inhaler Inhale 2 puffs into the lungs every 4 (four) hours as needed for wheezing or shortness of breath.    [provider]  amLODipine (NORVASC) 5 MG tablet Take 5 mg by mouth daily.    [provider]  atorvastatin (LIPITOR) 40 MG tablet Take 40 mg by mouth daily.    [provider]  azithromycin (ZITHROMAX Z-PAK) 250 MG tablet Take 1 tablet (250 mg total) by mouth daily. Take as directed 05/07/17   Lajean Saver, MD  buPROPion (WELLBUTRIN) 100 MG tablet Take 30 mg by mouth once.    [provider]  carvedilol (COREG) 3.125 MG tablet Take 3.125 mg by mouth 2 (two) times daily with a meal.    [provider]  furosemide (LASIX) 40 MG tablet Take 1 tablet (40 mg total) by mouth daily. 0/8/67   Delora Fuel, MD  HYDROcodone-acetaminophen (NORCO/VICODIN) 5-325 MG tablet Take 1-2 tablets by mouth every 6 (six) hours as needed. 03/09/16   Ward, Delice Bison, DO  lubiprostone (AMITIZA) 8 MCG capsule Take 5 mcg by mouth 2 (two) times daily with a meal.    [provider]  nitroGLYCERIN (NITROSTAT) 0.4 MG SL tablet Place 0.4 mg under the tongue every 5 (five) minutes as needed for chest pain.    [provider]  omeprazole (PRILOSEC) 40 MG capsule Take 40 mg by mouth daily.  [provider]  ondansetron (ZOFRAN ODT) 4 MG disintegrating tablet Take 1 tablet (4 mg total) by mouth every 8 (eight) hours as needed for nausea or vomiting. 03/09/16   Ward, Delice Bison, DO  oxyCODONE-acetaminophen (PERCOCET) 10-325 MG tablet Take 1 tablet by mouth 3 (three) times daily after meals.    [provider]  predniSONE (DELTASONE) 20 MG tablet Take 3 tablets (60 mg total) by mouth daily. 05/07/17   Lajean Saver, MD  Promethazine HCl (PHENERGAN PO) Take by mouth.    [provider]  tamsulosin (FLOMAX) 0.4 MG CAPS capsule Take 0.4 mg by mouth daily.    [provider]    Allergies Pneumococcal vaccines; Sulfa antibiotics; and Aspirin   REVIEW OF SYSTEMS  Negative except as noted here or in the History of Present Illness.   PHYSICAL  EXAMINATION  Initial Vital Signs Blood pressure (!) 186/115, pulse 72, temperature 97.8 F (36.6 C), temperature source Oral, resp. rate 16, height 6\' 2"  (1.88 m), weight 96.2 kg, SpO2 100 %.  Examination General: Well-developed, well-nourished male in no acute distress; appearance consistent with age of record HENT: normocephalic; atraumatic; well-healed surgical changes to left ear and left cheek Eyes: pupils equal, round and reactive to light; extraocular muscles intact Neck: supple Heart: regular rate and rhythm Lungs: clear to auscultation bilaterally Chest: Well localized right parasternal tenderness Abdomen: soft; mildly distended; nontender; bowel sounds present Rectal: Normal sphincter tone; no formed stool in vault; stool on examining glove light brown; significantly enlarged but nontender prostate Extremities: No deformity; full range of motion; pulses normal; trace edema of lower extremities Neurologic: Awake, alert and oriented; motor function intact in all extremities and symmetric; no facial droop Skin: Warm and dry Psychiatric: Normal mood and affect   RESULTS  Summary of this visit's results, reviewed by myself:   EKG Interpretation  Date/Time:  Wednesday November 01 2017 05:35:10 EDT Ventricular Rate:  70 PR Interval:    QRS Duration: 120 QT Interval:  455 QTC Calculation: 491 R Axis:   -45 Text Interpretation:  Sinus rhythm LVH with IVCD, LAD and secondary repol abnrm Borderline prolonged QT interval No significant change was found Confirmed by Shanon Rosser [phone]) on 11/01/2017 5:38:33 AM      Laboratory Studies: Results for orders placed or performed during the hospital encounter of 11/01/17 (from the past 24 hour(s))  CBC with Differential/Platelet     Status: Abnormal   Collection Time: 11/01/17  5:57 AM  Result Value Ref Range   WBC 5.6 4.0 - 10.5 K/uL   RBC 4.03 (L) 4.22 - 5.81 MIL/uL   Hemoglobin 12.9 (L) 13.0 - 17.0 g/dL   HCT 39.0 39.0 - 52.0 %     MCV 96.8 80.0 - 100.0 fL   MCH 32.0 26.0 - 34.0 pg   MCHC 33.1 30.0 - 36.0 g/dL   RDW 13.0 11.5 - 15.5 %   Platelets 246 150 - 400 K/uL   nRBC 0.0 0.0 - 0.2 %   Neutrophils Relative % 47 %   Neutro Abs 2.7 1.7 - 7.7 K/uL   Lymphocytes Relative 36 %   Lymphs Abs 2.0 0.7 - 4.0 K/uL   Monocytes Relative 12 %   Monocytes Absolute 0.7 0.1 - 1.0 K/uL   Eosinophils Relative 3 %   Eosinophils Absolute 0.2 0.0 - 0.5 K/uL   Basophils Relative 1 %   Basophils Absolute 0.0 0.0 - 0.1 K/uL   Immature Granulocytes 1 %   Abs Immature Granulocytes  0.04 0.00 - 0.07 K/uL  Basic metabolic panel     Status: Abnormal   Collection Time: 11/01/17  5:57 AM  Result Value Ref Range   Sodium 139 135 - 145 mmol/L   Potassium 3.2 (L) 3.5 - 5.1 mmol/L   Chloride 105 98 - 111 mmol/L   CO2 25 22 - 32 mmol/L   Glucose, Bld 103 (H) 70 - 99 mg/dL   BUN 12 8 - 23 mg/dL   Creatinine, Ser 1.08 0.61 - 1.24 mg/dL   Calcium 8.8 (L) 8.9 - 10.3 mg/dL   GFR calc non Af Amer >60 >60 mL/min   GFR calc Af Amer >60 >60 mL/min   Anion gap 9 5 - 15  Troponin I     Status: None   Collection Time: 11/01/17  5:57 AM  Result Value Ref Range   Troponin I <0.03 <0.03 ng/mL   Imaging Studies: Dg Chest 2 View  Result Date: 11/01/2017 CLINICAL DATA:  77 y/o M; chest pain this a.m. No bowel movement for 5 days. Antibiotics for UTI. EXAM: CHEST - 2 VIEW COMPARISON:  10/25/2017 chest radiograph. FINDINGS: Normal cardiac silhouette given projection and technique. Calcific aortic atherosclerosis. No consolidation, effusion, or pneumothorax. No acute osseous abnormality is evident. IMPRESSION: No acute pulmonary process identified. Electronically Signed   By: Kristine Garbe M.D.   On: 11/01/2017 06:38   Dg Abd Acute W/chest  Result Date: 11/01/2017 CLINICAL DATA:  77 y/o M; chest pain this a.m. No bowel movement for 5 days. Antibiotics for UTI. EXAM: DG ABDOMEN ACUTE W/ 1V CHEST COMPARISON:  None. FINDINGS: There is no  evidence of dilated bowel loops or free intraperitoneal air. No radiopaque calculi or other significant radiographic abnormality is seen. Moderate volume of stool throughout the colon. IMPRESSION: Nonobstructive bowel gas pattern. Moderate volume of stool throughout the colon may reflect constipation. Electronically Signed   By: Kristine Garbe M.D.   On: 11/01/2017 06:45    ED COURSE and MDM  Nursing notes and initial vitals signs, including pulse oximetry, reviewed.  Vitals:   11/01/17 0541 11/01/17 0543 11/01/17 0628  BP: (!) 186/115  (!) 190/97  Pulse: 72  63  Resp: 16  16  Temp: 97.8 F (36.6 C)    TempSrc: Oral    SpO2: 100%  100%  Weight:  96.2 kg   Height:  6\' 2"  (1.88 m)    6:54 AM Chest pain is consistent with chest wall pain.  His EKG is on changed and his symptoms are not consistent with acute coronary syndrome.  For his constipation we will administer an enema and have him follow-up with Dr. Harrell Lark, his PCP.  PROCEDURES    ED DIAGNOSES     ICD-10-CM   1. Chest wall pain R07.89   2. Constipation, unspecified constipation type K59.00        Lacresha Fusilier, Jenny Reichmann, MD 11/01/17 [phone]

## 2017-11-01 NOTE — ED Notes (Signed)
Returned from xray

## 2017-11-01 NOTE — ED Notes (Signed)
Patient transported to X-ray 

## 2017-11-01 NOTE — ED Notes (Signed)
ED Provider at bedside. 

## 2017-11-01 NOTE — ED Triage Notes (Signed)
Pt states he woke up this morning with pain in his chest   Pt states he has not had a bowel movement in the past 5 days  Pt has been able to pass gas  Pt called his doctor and he called him in some medication for the constipation but it has not worked yet  Pt states he has been taking an antibiotic for a UTI

## 2017-12-14 ENCOUNTER — Emergency Department (HOSPITAL_BASED_OUTPATIENT_CLINIC_OR_DEPARTMENT_OTHER)
Admission: EM | Admit: 2017-12-14 | Discharge: 2017-12-14 | Disposition: A | Discharge status: Home / Self Care | Payer: Medicare HMO | Attending: Emergency Medicine | Admitting: Emergency Medicine

## 2017-12-14 ENCOUNTER — Encounter (HOSPITAL_BASED_OUTPATIENT_CLINIC_OR_DEPARTMENT_OTHER): Payer: Self-pay | Admitting: Emergency Medicine

## 2017-12-14 ENCOUNTER — Other Ambulatory Visit: Payer: Self-pay

## 2017-12-14 DIAGNOSIS — R5383 Other fatigue: Secondary | ICD-10-CM | POA: Diagnosis not present

## 2017-12-14 DIAGNOSIS — R350 Frequency of micturition: Secondary | ICD-10-CM | POA: Diagnosis present

## 2017-12-14 DIAGNOSIS — Z79899 Other long term (current) drug therapy: Secondary | ICD-10-CM | POA: Insufficient documentation

## 2017-12-14 DIAGNOSIS — J449 Chronic obstructive pulmonary disease, unspecified: Secondary | ICD-10-CM | POA: Insufficient documentation

## 2017-12-14 DIAGNOSIS — I10 Essential (primary) hypertension: Secondary | ICD-10-CM | POA: Diagnosis not present

## 2017-12-14 LAB — URINALYSIS, ROUTINE W REFLEX MICROSCOPIC
BILIRUBIN URINE: NEGATIVE
Glucose, UA: NEGATIVE mg/dL
KETONES UR: NEGATIVE mg/dL
Leukocytes, UA: NEGATIVE
NITRITE: NEGATIVE
Protein, ur: NEGATIVE mg/dL
SPECIFIC GRAVITY, URINE: 1.015 (ref 1.005–1.030)
pH: 6.5 (ref 5.0–8.0)

## 2017-12-14 LAB — CBC
HCT: 41.4 % (ref 39.0–52.0)
Hemoglobin: 13.7 g/dL (ref 13.0–17.0)
MCH: 31.9 pg (ref 26.0–34.0)
MCHC: 33.1 g/dL (ref 30.0–36.0)
MCV: 96.3 fL (ref 80.0–100.0)
Platelets: 239 10*3/uL (ref 150–400)
RBC: 4.3 MIL/uL (ref 4.22–5.81)
RDW: 12.8 % (ref 11.5–15.5)
WBC: 4.9 10*3/uL (ref 4.0–10.5)
nRBC: 0 % (ref 0.0–0.2)

## 2017-12-14 LAB — BASIC METABOLIC PANEL
Anion gap: 7 (ref 5–15)
BUN: 14 mg/dL (ref 8–23)
CO2: 26 mmol/L (ref 22–32)
Calcium: 9 mg/dL (ref 8.9–10.3)
Chloride: 106 mmol/L (ref 98–111)
Creatinine, Ser: 1.1 mg/dL (ref 0.61–1.24)
GFR calc Af Amer: >60 mL/min (ref >60)
GFR calc non Af Amer: >60 mL/min (ref >60)
Glucose, Bld: 112 mg/dL — ABNORMAL HIGH (ref 70–99)
Potassium: 3.8 mmol/L (ref 3.5–5.1)
Sodium: 139 mmol/L (ref 135–145)

## 2017-12-14 LAB — URINALYSIS, MICROSCOPIC (REFLEX)

## 2017-12-14 LAB — CBG MONITORING, ED: Glucose-Capillary: 94 mg/dL (ref 70–99)

## 2017-12-14 NOTE — ED Provider Notes (Signed)
[facility] EMERGENCY DEPARTMENT Provider Note   CSN: [id number] Arrival date & time: 12/14/17  9767     History   Chief Complaint Chief Complaint  Patient presents with  . Urinary Frequency  . Fatigue    HPI Casey Moreno is a 77 y.o. male.  HPI Patient presents with urinary frequency.  Began last night.  States he has to go frequently.  Around 11 days ago started on doxycycline for urinary tract infection.  Given 14-day course.  No flank pain or fever but states the disorder feels bad all over.  No abdominal pain.  No perineal pain.  No dysuria.  No fevers or chills. Past Medical History:  Diagnosis Date  . Arthritis   . Cancer ([place])   . COPD (chronic obstructive pulmonary disease) ([place])   . Diverticulosis   . Hyperlipidemia   . Hypertension   . Kidney stones   . Osteoarthritis   . Renal disorder   . Skin cancer     There are no active problems to display for this patient.   Past Surgical History:  Procedure Laterality Date  . HERNIA REPAIR    . PROSTATE ABLATION    . SKIN BIOPSY    . SKIN GRAFT          Home Medications    Prior to Admission medications   Medication Sig Start Date End Date Taking? Authorizing Provider  albuterol (PROVENTIL HFA;VENTOLIN HFA) 108 (90 BASE) MCG/ACT inhaler Inhale 2 puffs into the lungs every 4 (four) hours as needed for wheezing or shortness of breath.    [provider]  amLODipine (NORVASC) 5 MG tablet Take 5 mg by mouth daily.    [provider]  atorvastatin (LIPITOR) 40 MG tablet Take 40 mg by mouth daily.    [provider]  azithromycin (ZITHROMAX Z-PAK) 250 MG tablet Take 1 tablet (250 mg total) by mouth daily. Take as directed 05/07/17   Lajean Saver, MD  buPROPion (WELLBUTRIN) 100 MG tablet Take 30 mg by mouth once.    [provider]  carvedilol (COREG) 3.125 MG tablet Take 3.125 mg by mouth 2 (two) times daily with a meal.    [provider]  furosemide  (LASIX) 40 MG tablet Take 1 tablet (40 mg total) by mouth daily. 03/20/17   Delora Fuel, MD  HYDROcodone-acetaminophen (NORCO/VICODIN) 5-325 MG tablet Take 1-2 tablets by mouth every 6 (six) hours as needed. 03/09/16   Ward, Delice Bison, DO  lubiprostone (AMITIZA) 8 MCG capsule Take 5 mcg by mouth 2 (two) times daily with a meal.    [provider]  nitroGLYCERIN (NITROSTAT) 0.4 MG SL tablet Place 0.4 mg under the tongue every 5 (five) minutes as needed for chest pain.    [provider]  omeprazole (PRILOSEC) 40 MG capsule Take 40 mg by mouth daily.    [provider]  ondansetron (ZOFRAN ODT) 4 MG disintegrating tablet Take 1 tablet (4 mg total) by mouth every 8 (eight) hours as needed for nausea or vomiting. 03/09/16   Ward, Delice Bison, DO  oxyCODONE-acetaminophen (PERCOCET) 10-325 MG tablet Take 1 tablet by mouth 3 (three) times daily after meals.    [provider]  predniSONE (DELTASONE) 20 MG tablet Take 3 tablets (60 mg total) by mouth daily. 05/07/17   Lajean Saver, MD  Promethazine HCl (PHENERGAN PO) Take by mouth.    [provider]  tamsulosin (FLOMAX) 0.4 MG CAPS capsule Take 0.4 mg by mouth  daily.    [provider]    Family History Family History  Problem Relation Age of Onset  . Asthma Mother     Social History Social History   Tobacco Use  . Smoking status: Never Smoker  . Smokeless tobacco: Never Used  Substance Use Topics  . Alcohol use: Not Currently  . Drug use: No     Allergies   Pneumococcal vaccines; Sulfa antibiotics; and Aspirin   Review of Systems Review of Systems  Constitutional: Positive for fatigue. Negative for appetite change.  HENT: Negative for congestion.   Respiratory: Negative for shortness of breath.   Cardiovascular: Negative for chest pain.  Gastrointestinal: Negative for abdominal pain.  Genitourinary: Positive for frequency. Negative for penile swelling and scrotal swelling.    Musculoskeletal: Negative for back pain.  Skin: Negative for rash.  Neurological: Negative for weakness.  Psychiatric/Behavioral: Negative for confusion.     Physical Exam Updated Vital Signs BP (!) 188/109   Pulse 69   Temp 97.7 F (36.5 C) (Oral)   Resp 16   Ht 6\' 1"  (1.854 m)   Wt 86.6 kg   SpO2 100%   BMI 25.20 kg/m   Physical Exam  Constitutional: He appears well-developed.  HENT:  Head: Normocephalic.  Eyes: Pupils are equal, round, and reactive to light.  Neck: Neck supple.  Cardiovascular: Normal rate.  Pulmonary/Chest: Effort normal.  Abdominal: There is no tenderness.  Genitourinary:  Genitourinary Comments: No perineal tenderness.  Musculoskeletal: He exhibits no edema.  Neurological: He is alert.  Skin: Skin is warm.     ED Treatments / Results  Labs (all labs ordered are listed, but only abnormal results are displayed) Labs Reviewed  URINALYSIS, ROUTINE W REFLEX MICROSCOPIC - Abnormal; Notable for the following components:      Result Value   Hgb urine dipstick TRACE (*)    All other components within normal limits  BASIC METABOLIC PANEL - Abnormal; Notable for the following components:   Glucose, Bld 112 (*)    All other components within normal limits  URINALYSIS, MICROSCOPIC (REFLEX) - Abnormal; Notable for the following components:   Bacteria, UA RARE (*)    All other components within normal limits  URINE CULTURE  CBC  CBG MONITORING, ED    EKG None  Radiology No results found.  Procedures Procedures (including critical care time)  Medications Ordered in ED Medications - No data to display   Initial Impression / Assessment and Plan / ED Course  I have reviewed the triage vital signs and the nursing notes.  Pertinent labs & imaging results that were available during my care of the patient were reviewed by me and considered in my medical decision making (see chart for details).    Patient with some fatigue and urinary  frequency.  No perineal tenderness but is currently on doxycycline.  Urinalysis reassuring.  Will discharge home.  Patient states he feels okay for discharge.  Will continue antibiotic  Final Clinical Impressions(s) / ED Diagnoses   Final diagnoses:  Urinary frequency    ED Discharge Orders    None       Davonna Belling, MD 12/14/17 1206

## 2017-12-14 NOTE — ED Triage Notes (Addendum)
Reports urinary frequency since last night.  Denies flank pain, fevers.  Currently on doxycycline for UTI.  States he began those approximately 11 days ago

## 2017-12-14 NOTE — ED Notes (Signed)
Pt on monitor 

## 2017-12-15 LAB — URINE CULTURE: Culture: NO GROWTH

## 2018-06-13 ENCOUNTER — Emergency Department (HOSPITAL_BASED_OUTPATIENT_CLINIC_OR_DEPARTMENT_OTHER)
Admission: EM | Admit: 2018-06-13 | Discharge: 2018-06-13 | Disposition: A | Discharge status: Home / Self Care | Payer: Medicare HMO | Attending: Emergency Medicine | Admitting: Emergency Medicine

## 2018-06-13 ENCOUNTER — Other Ambulatory Visit: Payer: Self-pay

## 2018-06-13 ENCOUNTER — Emergency Department (HOSPITAL_BASED_OUTPATIENT_CLINIC_OR_DEPARTMENT_OTHER): Payer: Medicare HMO

## 2018-06-13 ENCOUNTER — Encounter (HOSPITAL_BASED_OUTPATIENT_CLINIC_OR_DEPARTMENT_OTHER): Payer: Self-pay | Admitting: *Deleted

## 2018-06-13 DIAGNOSIS — M545 Low back pain, unspecified: Secondary | ICD-10-CM

## 2018-06-13 DIAGNOSIS — J449 Chronic obstructive pulmonary disease, unspecified: Secondary | ICD-10-CM | POA: Insufficient documentation

## 2018-06-13 DIAGNOSIS — I10 Essential (primary) hypertension: Secondary | ICD-10-CM | POA: Diagnosis not present

## 2018-06-13 MED ORDER — LIDOCAINE 5 % EX OINT: 1.0000 "application " | TOPICAL_OINTMENT | CUTANEOUS | 0 refills | Status: AC | PRN

## 2018-06-13 NOTE — Discharge Instructions (Signed)

## 2018-06-13 NOTE — ED Notes (Signed)
Pt escorted to front of building and voiced understanding of d/c paperwork.

## 2018-06-13 NOTE — ED Notes (Signed)
ED Provider at bedside. 

## 2018-06-13 NOTE — ED Triage Notes (Signed)
pt c/o left lower back pain x 3 days, seen by Pain clinic for same

## 2018-06-13 NOTE — ED Notes (Signed)
Patient transported to X-ray 

## 2018-06-13 NOTE — ED Provider Notes (Signed)
Emergency Department Provider Note   I have reviewed the triage vital signs and the nursing notes.   HISTORY  Chief Complaint Back Pain   HPI Casey Moreno is a 78 y.o. male with PMH of COPD, arthritis, HTN, and HLD presents to the emergency department for evaluation of left lower back pain over the past 3 days.  Patient states pain began while he was lifting something.  He states he was bending over and pulling up when he felt a "catch" in his left lower back.  He has pain with movement or touching the area.  He follows with pain management for his arthritis pain.  He mentioned this to them during a recent appointment.  He states they gave him an injection and he has been taking his typical oxycodone at home with no change in symptoms.  He is not experiencing any radiation of pain down the legs.  No weakness or numbness in the lower extremities.  No groin numbness.  No bowel or bladder incontinence.  No fevers.  No abdominal pain. No other modifying factors.    Past Medical History:  Diagnosis Date   Arthritis    Cancer ([place])    COPD (chronic obstructive pulmonary disease) ([place])    Diverticulosis    Hyperlipidemia    Hypertension    Kidney stones    Osteoarthritis    Renal disorder    Skin cancer     There are no active problems to display for this patient.   Past Surgical History:  Procedure Laterality Date   HERNIA REPAIR     PROSTATE ABLATION     SKIN BIOPSY     SKIN GRAFT      Allergies Pneumococcal vaccines; Sulfa antibiotics; and Aspirin  Family History  Problem Relation Age of Onset   Asthma Mother     Social History Social History   Tobacco Use   Smoking status: Never Smoker   Smokeless tobacco: Never Used  Substance Use Topics   Alcohol use: Not Currently   Drug use: No    Review of Systems  Constitutional: No fever/chills Eyes: No visual changes. ENT: No sore throat. Cardiovascular: Denies chest pain. Respiratory:  Denies shortness of breath. Gastrointestinal: No abdominal pain.  No nausea, no vomiting.  No diarrhea.  No constipation. Genitourinary: Negative for dysuria. Musculoskeletal: Positive for back pain. Skin: Negative for rash. Neurological: Negative for headaches, focal weakness or numbness.  10-point ROS otherwise negative.  ____________________________________________   PHYSICAL EXAM:  VITAL SIGNS: ED Triage Vitals  Enc Vitals Group     BP 06/13/18 2108 (!) 133/94     Pulse Rate 06/13/18 2106 79     Resp 06/13/18 2106 18     Temp 06/13/18 2106 97.8 F (36.6 C)     Temp Source 06/13/18 2106 Oral     SpO2 06/13/18 2106 99 %     Weight 06/13/18 2107 199 lb (90.3 kg)     Height 06/13/18 2107 6\' 1"  (1.854 m)     Pain Score 06/13/18 2106 9   Constitutional: Alert and oriented. Well appearing and in no acute distress. Eyes: Conjunctivae are normal. Head: Atraumatic. Nose: No congestion/rhinnorhea. Mouth/Throat: Mucous membranes are moist.  Neck: No stridor. Cardiovascular: Normal rate, regular rhythm. Good peripheral circulation. Grossly normal heart sounds.   Respiratory: Normal respiratory effort.  No retractions. Lungs CTAB. Gastrointestinal: Soft and nontender. No distention.  Musculoskeletal: No lower extremity tenderness nor edema. No gross deformities of extremities. Tenderness to palpation  over the lower back and left paraspinal muscles. No rash.  Neurologic:  Normal speech and language. No gross focal neurologic deficits are appreciated.  Skin:  Skin is warm, dry and intact. No rash noted.  ____________________________________________  RADIOLOGY  Dg Lumbar Spine 2-3 Views  Result Date: 06/13/2018 CLINICAL DATA:  Low back pain EXAM: LUMBAR SPINE - 2-3 VIEW COMPARISON:  01/28/2018 FINDINGS: There is no evidence of lumbar spine fracture. Alignment is normal. Intervertebral disc spaces are maintained. Mild multilevel disc height loss is noted. Mild diffuse osteopenia is  noted. Atherosclerotic disease is noted of the abdominal aorta. IMPRESSION: No acute osseous abnormality. Electronically Signed   By: Constance Holster M.D.   On: 06/13/2018 21:50    ____________________________________________   PROCEDURES  Procedure(s) performed:   Procedures  None ____________________________________________   INITIAL IMPRESSION / ASSESSMENT AND PLAN / ED COURSE  Pertinent labs & imaging results that were available during my care of the patient were reviewed by me and considered in my medical decision making (see chart for details).   Patient presents to the emergency department for evaluation of lower back pain radiating to the left.  No radicular symptoms.  No signs or symptoms to raise suspicion for acute spine emergency.  Very low suspicion for vascular etiology of pain such as AAA or dissection.  Can reproduce the patient's back pain with palpation over the paraspinal musculature.  Given the patient's age I do plan for plain film to assess for any obvious pathologic type fracture or bony lesion.  Patient already on oxycodone.  Would try topical type medications at home pending plain film.   Plain films reviewed. No acute findings. Gave info for spine surgery to schedule f/u if pain worsens. Discussed ED return precautions.  ____________________________________________  FINAL CLINICAL IMPRESSION(S) / ED DIAGNOSES  Final diagnoses:  Acute left-sided low back pain without sciatica    NEW OUTPATIENT MEDICATIONS STARTED DURING THIS VISIT:  Discharge Medication List as of 06/13/2018 10:02 PM    START taking these medications   Details  lidocaine (XYLOCAINE) 5 % ointment Apply 1 application topically as needed., Starting Wed 06/13/2018, Print        Note:  This document was prepared using Dragon voice recognition software and may include unintentional dictation errors.  Nanda Quinton, MD Emergency Medicine    Savalas Monje, W[facility] Old[facility], MD 06/14/18 [phone]

## 2018-06-13 NOTE — ED Notes (Signed)
Pt ambulatory to BR with slow steady gate.

## 2019-09-02 ENCOUNTER — Other Ambulatory Visit: Payer: Self-pay

## 2019-09-02 ENCOUNTER — Emergency Department (HOSPITAL_BASED_OUTPATIENT_CLINIC_OR_DEPARTMENT_OTHER)
Admission: EM | Admit: 2019-09-02 | Discharge: 2019-09-02 | Disposition: A | Discharge status: Home / Self Care | Payer: Medicare HMO | Attending: Emergency Medicine | Admitting: Emergency Medicine

## 2019-09-02 ENCOUNTER — Encounter (HOSPITAL_BASED_OUTPATIENT_CLINIC_OR_DEPARTMENT_OTHER): Payer: Self-pay

## 2019-09-02 DIAGNOSIS — R5383 Other fatigue: Secondary | ICD-10-CM | POA: Insufficient documentation

## 2019-09-02 DIAGNOSIS — Z5321 Procedure and treatment not carried out due to patient leaving prior to being seen by health care provider: Secondary | ICD-10-CM | POA: Diagnosis not present

## 2019-09-02 DIAGNOSIS — R630 Anorexia: Secondary | ICD-10-CM | POA: Insufficient documentation

## 2019-09-02 LAB — URINALYSIS, MICROSCOPIC (REFLEX): WBC, UA: >50 WBC/hpf (ref 0–5)

## 2019-09-02 LAB — CBC WITH DIFFERENTIAL/PLATELET
Abs Immature Granulocytes: 0.03 10*3/uL (ref 0.00–0.07)
Basophils Absolute: 0 10*3/uL (ref 0.0–0.1)
Basophils Relative: 0 %
Eosinophils Absolute: 0.1 10*3/uL (ref 0.0–0.5)
Eosinophils Relative: 1 %
HCT: 45.7 % (ref 39.0–52.0)
Hemoglobin: 15.5 g/dL (ref 13.0–17.0)
Immature Granulocytes: 0 %
Lymphocytes Relative: 31 %
Lymphs Abs: 2.8 10*3/uL (ref 0.7–4.0)
MCH: 32.1 pg (ref 26.0–34.0)
MCHC: 33.9 g/dL (ref 30.0–36.0)
MCV: 94.6 fL (ref 80.0–100.0)
Monocytes Absolute: 0.8 10*3/uL (ref 0.1–1.0)
Monocytes Relative: 9 %
Neutro Abs: 5.3 10*3/uL (ref 1.7–7.7)
Neutrophils Relative %: 59 %
Platelets: 260 10*3/uL (ref 150–400)
RBC: 4.83 MIL/uL (ref 4.22–5.81)
RDW: 12.6 % (ref 11.5–15.5)
WBC: 9.1 10*3/uL (ref 4.0–10.5)
nRBC: 0 % (ref 0.0–0.2)

## 2019-09-02 LAB — BASIC METABOLIC PANEL
Anion gap: 12 (ref 5–15)
BUN: 12 mg/dL (ref 8–23)
CO2: 23 mmol/L (ref 22–32)
Calcium: 9.4 mg/dL (ref 8.9–10.3)
Chloride: 101 mmol/L (ref 98–111)
Creatinine, Ser: 1.24 mg/dL (ref 0.61–1.24)
GFR calc Af Amer: >60 mL/min (ref >60)
GFR calc non Af Amer: 55 mL/min — ABNORMAL LOW (ref >60)
Glucose, Bld: 129 mg/dL — ABNORMAL HIGH (ref 70–99)
Potassium: 4.1 mmol/L (ref 3.5–5.1)
Sodium: 136 mmol/L (ref 135–145)

## 2019-09-02 LAB — URINALYSIS, ROUTINE W REFLEX MICROSCOPIC
Glucose, UA: NEGATIVE mg/dL
Ketones, ur: NEGATIVE mg/dL
Nitrite: NEGATIVE
Protein, ur: NEGATIVE mg/dL
Specific Gravity, Urine: 1.015 (ref 1.005–1.030)
pH: 6 (ref 5.0–8.0)

## 2019-09-02 NOTE — ED Triage Notes (Addendum)
Pt c/o fatigue, decreased appetite x 1 week-denies diarrhea/vomited x 1 yesterday-denies pain except for intermittent HA-denies fever/flu sx-NAD-to triage in w/c-states he had a neg covid test last week-no covid vaccine

## 2020-08-22 ENCOUNTER — Emergency Department (HOSPITAL_BASED_OUTPATIENT_CLINIC_OR_DEPARTMENT_OTHER): Payer: Medicare HMO

## 2020-08-22 ENCOUNTER — Other Ambulatory Visit: Payer: Self-pay

## 2020-08-22 ENCOUNTER — Encounter (HOSPITAL_BASED_OUTPATIENT_CLINIC_OR_DEPARTMENT_OTHER): Payer: Self-pay

## 2020-08-22 ENCOUNTER — Emergency Department (HOSPITAL_BASED_OUTPATIENT_CLINIC_OR_DEPARTMENT_OTHER)
Admission: EM | Admit: 2020-08-22 | Discharge: 2020-08-22 | Disposition: A | Discharge status: Home / Self Care | Payer: Medicare HMO | Attending: Emergency Medicine | Admitting: Emergency Medicine

## 2020-08-22 DIAGNOSIS — J449 Chronic obstructive pulmonary disease, unspecified: Secondary | ICD-10-CM | POA: Diagnosis not present

## 2020-08-22 DIAGNOSIS — Z79899 Other long term (current) drug therapy: Secondary | ICD-10-CM | POA: Diagnosis not present

## 2020-08-22 DIAGNOSIS — I10 Essential (primary) hypertension: Secondary | ICD-10-CM | POA: Diagnosis not present

## 2020-08-22 DIAGNOSIS — Z85828 Personal history of other malignant neoplasm of skin: Secondary | ICD-10-CM | POA: Insufficient documentation

## 2020-08-22 DIAGNOSIS — R251 Tremor, unspecified: Secondary | ICD-10-CM | POA: Insufficient documentation

## 2020-08-22 DIAGNOSIS — R531 Weakness: Secondary | ICD-10-CM

## 2020-08-22 DIAGNOSIS — I4891 Unspecified atrial fibrillation: Secondary | ICD-10-CM | POA: Insufficient documentation

## 2020-08-22 LAB — TSH: TSH: 2.874 u[IU]/mL (ref 0.350–4.500)

## 2020-08-22 LAB — COMPREHENSIVE METABOLIC PANEL
ALT: 12 U/L (ref 0–44)
AST: 20 U/L (ref 15–41)
Albumin: 3.8 g/dL (ref 3.5–5.0)
Alkaline Phosphatase: 50 U/L (ref 38–126)
Anion gap: 7 (ref 5–15)
BUN: 14 mg/dL (ref 8–23)
CO2: 27 mmol/L (ref 22–32)
Calcium: 8.8 mg/dL — ABNORMAL LOW (ref 8.9–10.3)
Chloride: 101 mmol/L (ref 98–111)
Creatinine, Ser: 1.34 mg/dL — ABNORMAL HIGH (ref 0.61–1.24)
GFR, Estimated: 54 mL/min — ABNORMAL LOW (ref >60)
Glucose, Bld: 100 mg/dL — ABNORMAL HIGH (ref 70–99)
Potassium: 3.9 mmol/L (ref 3.5–5.1)
Sodium: 135 mmol/L (ref 135–145)
Total Bilirubin: 0.9 mg/dL (ref 0.3–1.2)
Total Protein: 6.8 g/dL (ref 6.5–8.1)

## 2020-08-22 LAB — URINALYSIS, MICROSCOPIC (REFLEX)

## 2020-08-22 LAB — CBC
HCT: 36.3 % — ABNORMAL LOW (ref 39.0–52.0)
Hemoglobin: 12 g/dL — ABNORMAL LOW (ref 13.0–17.0)
MCH: 31.3 pg (ref 26.0–34.0)
MCHC: 33.1 g/dL (ref 30.0–36.0)
MCV: 94.5 fL (ref 80.0–100.0)
Platelets: 224 10*3/uL (ref 150–400)
RBC: 3.84 MIL/uL — ABNORMAL LOW (ref 4.22–5.81)
RDW: 12.9 % (ref 11.5–15.5)
WBC: 6.2 10*3/uL (ref 4.0–10.5)
nRBC: 0 % (ref 0.0–0.2)

## 2020-08-22 LAB — URINALYSIS, ROUTINE W REFLEX MICROSCOPIC
Bilirubin Urine: NEGATIVE
Glucose, UA: NEGATIVE mg/dL
Ketones, ur: NEGATIVE mg/dL
Leukocytes,Ua: NEGATIVE
Nitrite: POSITIVE — AB
Protein, ur: NEGATIVE mg/dL
Specific Gravity, Urine: 1.025 (ref 1.005–1.030)
pH: 5.5 (ref 5.0–8.0)

## 2020-08-22 NOTE — Discharge Instructions (Addendum)
Follow-up with your doctor soon as possible.  You are in atrial fibrillation today.  A urine culture has been sent and you will be notified if it shows infection.

## 2020-08-22 NOTE — ED Triage Notes (Signed)
Pt complains of tremors for the past 3 months. Also states lack of appetite the past 2 weeks. Denies vomiting/diarrhea/abdominal pain.

## 2020-08-22 NOTE — ED Provider Notes (Signed)
[facility] EMERGENCY DEPARTMENT Provider Note   CSN: [id number] Arrival date & time: 08/22/20  1757     History Chief Complaint  Patient presents with   Tremors    Casey Moreno is a 80 y.o. male.  HPI Patient presents with generalized weakness and tremors.  Has had over the last 3 months.  States he was more shaky today.  For the last couple weeks and not had much of an appetite.  Less oral intake.  Reportedly is unsteady at times.  Patient states he is feeling better now and really does not want to come in the hospital.  Denies history of atrial fibrillation.  Seen at PCP yesterday.  Reportedly had low blood pressure and stopped blood pressure medicines because of it.  No fevers or chills.  No cough.  Feels as if he had some weight loss.    Past Medical History:  Diagnosis Date   Arthritis    Cancer ([place])    COPD (chronic obstructive pulmonary disease) ([place])    Diverticulosis    Hyperlipidemia    Hypertension    Kidney stones    Osteoarthritis    Renal disorder    Skin cancer     There are no problems to display for this patient.   Past Surgical History:  Procedure Laterality Date   HERNIA REPAIR     PROSTATE ABLATION     SKIN BIOPSY     SKIN GRAFT         Family History  Problem Relation Age of Onset   Asthma Mother     Social History   Tobacco Use   Smoking status: Never   Smokeless tobacco: Never  Vaping Use   Vaping Use: Never used  Substance Use Topics   Alcohol use: Not Currently   Drug use: No    Home Medications Prior to Admission medications   Medication Sig Start Date End Date Taking? Authorizing Provider  albuterol (PROVENTIL HFA;VENTOLIN HFA) 108 (90 BASE) MCG/ACT inhaler Inhale 2 puffs into the lungs every 4 (four) hours as needed for wheezing or shortness of breath.    [provider]  amLODipine (NORVASC) 5 MG tablet Take 5 mg by mouth daily.    [provider]  atorvastatin (LIPITOR) 40 MG tablet Take  40 mg by mouth daily.    [provider]  azithromycin (ZITHROMAX Z-PAK) 250 MG tablet Take 1 tablet (250 mg total) by mouth daily. Take as directed 05/07/17   Lajean Saver, MD  buPROPion (WELLBUTRIN) 100 MG tablet Take 30 mg by mouth once.    [provider]  carvedilol (COREG) 3.125 MG tablet Take 3.125 mg by mouth 2 (two) times daily with a meal.    [provider]  furosemide (LASIX) 40 MG tablet Take 1 tablet (40 mg total) by mouth daily. [id number]   Delora Fuel, MD  HYDROcodone-acetaminophen (NORCO/VICODIN) 5-325 MG tablet Take 1-2 tablets by mouth every 6 (six) hours as needed. 03/09/16   Ward, Delice Bison, DO  lidocaine (XYLOCAINE) 5 % ointment Apply 1 application topically as needed. 06/13/18   Long, Wonda Olds, MD  lubiprostone (AMITIZA) 8 MCG capsule Take 5 mcg by mouth 2 (two) times daily with a meal.    [provider]  nitroGLYCERIN (NITROSTAT) 0.4 MG SL tablet Place 0.4 mg under the tongue every 5 (five) minutes as needed for chest pain.    [provider]  omeprazole (PRILOSEC) 40 MG capsule Take 40 mg by mouth  daily.    [provider]  ondansetron (ZOFRAN ODT) 4 MG disintegrating tablet Take 1 tablet (4 mg total) by mouth every 8 (eight) hours as needed for nausea or vomiting. 03/09/16   Ward, Delice Bison, DO  oxyCODONE-acetaminophen (PERCOCET) 10-325 MG tablet Take 1 tablet by mouth 3 (three) times daily after meals.    [provider]  predniSONE (DELTASONE) 20 MG tablet Take 3 tablets (60 mg total) by mouth daily. 05/07/17   Lajean Saver, MD  Promethazine HCl (PHENERGAN PO) Take by mouth.    [provider]  tamsulosin (FLOMAX) 0.4 MG CAPS capsule Take 0.4 mg by mouth daily.    [provider]    Allergies    Pneumococcal vaccines, Sulfa antibiotics, and Aspirin  Review of Systems   Review of Systems  Constitutional:  Positive for appetite change and fatigue.  Respiratory:  Negative for shortness of  breath.   Cardiovascular:  Negative for chest pain.  Gastrointestinal:  Negative for abdominal pain.  Genitourinary:  Negative for flank pain.  Musculoskeletal:  Negative for back pain.  Skin:  Negative for rash.  Neurological:  Positive for tremors.  Psychiatric/Behavioral:  Negative for confusion.    Physical Exam Updated Vital Signs BP 127/86   Pulse 74   Temp 97.9 F (36.6 C) (Oral)   Resp 15   Ht '6\' 1"'$  (1.854 m)   SpO2 91%   BMI 26.25 kg/m   Physical Exam Vitals and nursing note reviewed.  HENT:     Head: Normocephalic.  Eyes:     Extraocular Movements: Extraocular movements intact.     Pupils: Pupils are equal, round, and reactive to light.  Cardiovascular:     Rate and Rhythm: Normal rate. Rhythm irregular.  Pulmonary:     Effort: Pulmonary effort is normal.  Abdominal:     Tenderness: There is no abdominal tenderness.  Genitourinary:    Comments: Patient appears to have been incontinent of urine. Skin:    General: Skin is warm.     Capillary Refill: Capillary refill takes less than 2 seconds.  Neurological:     Mental Status: He is alert and oriented to person, place, and time.     Comments: Does have a mild tremor particular on the right side.  Finger-nose intact but somewhat slowed bilaterally.    ED Results / Procedures / Treatments   Labs (all labs ordered are listed, but only abnormal results are displayed) Labs Reviewed  COMPREHENSIVE METABOLIC PANEL - Abnormal; Notable for the following components:      Result Value   Glucose, Bld 100 (*)    Creatinine, Ser 1.34 (*)    Calcium 8.8 (*)    GFR, Estimated 54 (*)    All other components within normal limits  CBC - Abnormal; Notable for the following components:   RBC 3.84 (*)    Hemoglobin 12.0 (*)    HCT 36.3 (*)    All other components within normal limits  URINALYSIS, ROUTINE W REFLEX MICROSCOPIC - Abnormal; Notable for the following components:   APPearance HAZY (*)    Hgb urine dipstick  TRACE (*)    Nitrite POSITIVE (*)    All other components within normal limits  URINALYSIS, MICROSCOPIC (REFLEX) - Abnormal; Notable for the following components:   Bacteria, UA MANY (*)    All other components within normal limits  TSH    EKG EKG Interpretation  Date/Time:  Saturday August 22 2020 19:33:44 EDT Ventricular Rate:  70  PR Interval:    QRS Duration: 108 QT Interval:  415 QTC Calculation: 448 R Axis:   -46 Text Interpretation: Atrial fibrillation LAD, consider left anterior fascicular block Anterior infarct, old afib is new Confirmed by Davonna Belling [phone]) on 08/22/2020 7:39:03 PM  Radiology CT HEAD WO CONTRAST (5MM)  Result Date: 08/22/2020 CLINICAL DATA:  Dizziness EXAM: CT HEAD WITHOUT CONTRAST TECHNIQUE: Contiguous axial images were obtained from the base of the skull through the vertex without intravenous contrast. COMPARISON:  08/12/2019 FINDINGS: Brain: Old bilateral basal ganglia lacunar infarcts. Low-density in the right parietal and posterior temporal lobes new since prior study but appears to represent chronic infarct with encephalomalacia. No acute infarct. No hemorrhage or hydrocephalus. Vascular: No hyperdense vessel or unexpected calcification. Skull: No acute calvarial abnormality. 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 Atrophy, chronic microvascular disease. No acute intracranial abnormality. Electronically Signed   By: Rolm Baptise M.D.   On: 08/22/2020 19:36   DG Chest Portable 1 View  Result Date: 08/22/2020 CLINICAL DATA:  Weakness, decreased appetite EXAM: PORTABLE CHEST 1 VIEW COMPARISON:  04/17/2020 FINDINGS: Heart is upper limits normal in size. No confluent airspace opacities or effusions. No acute bony abnormality. IMPRESSION: No active disease. Electronically Signed   By: Rolm Baptise M.D.   On: 08/22/2020 19:37    Procedures Procedures   Medications Ordered in  ED Medications - No data to display  ED Course  I have reviewed the triage vital signs and the nursing notes.  Pertinent labs & imaging results that were available during my care of the patient were reviewed by me and considered in my medical decision making (see chart for details).    MDM Rules/Calculators/A&P                           Patient presented with some tremors.  States he was shaking.  States been feeling weak.  States he feels a lot better now and his wife wanted to come in the hospital.  Does have a mild tremor.  Has had some previous strokes.  I do not see documentation in the PCP note of having atrial fibrillation previously, however patient and family Amber states he has had a before.  Had previously been on blood thinners but not currently.  Found to be in A. fib today.  Does not want further anticoagulation at this time no follow with PCP.  Urine does show many bacteria.  States he has had bacteria in the urine before and tends to get antibiotics.  Not necessarily having dysuria but had been feeling more weak.  With the weakness I would potentially treat if urine culture comes back positive but not going to treat empirically.  Discharge home with outpatient follow-up.  Also mild anemia.  And creatinine slightly elevated. Final Clinical Impression(s) / ED Diagnoses Final diagnoses:  Atrial fibrillation, unspecified type ([place])  Weakness    Rx / DC Orders ED Discharge Orders     None        Davonna Belling, MD 08/22/20 2036

## 2021-01-03 ENCOUNTER — Other Ambulatory Visit: Payer: Self-pay

## 2021-01-03 ENCOUNTER — Emergency Department (HOSPITAL_BASED_OUTPATIENT_CLINIC_OR_DEPARTMENT_OTHER)
Admission: EM | Admit: 2021-01-03 | Discharge: 2021-01-04 | Disposition: A | Discharge status: Home / Self Care | Payer: Medicare Other | Attending: Emergency Medicine | Admitting: Emergency Medicine

## 2021-01-03 ENCOUNTER — Emergency Department (HOSPITAL_BASED_OUTPATIENT_CLINIC_OR_DEPARTMENT_OTHER): Payer: Medicare Other

## 2021-01-03 DIAGNOSIS — Z20822 Contact with and (suspected) exposure to covid-19: Secondary | ICD-10-CM | POA: Insufficient documentation

## 2021-01-03 DIAGNOSIS — Z79899 Other long term (current) drug therapy: Secondary | ICD-10-CM | POA: Insufficient documentation

## 2021-01-03 DIAGNOSIS — J449 Chronic obstructive pulmonary disease, unspecified: Secondary | ICD-10-CM | POA: Insufficient documentation

## 2021-01-03 DIAGNOSIS — I10 Essential (primary) hypertension: Secondary | ICD-10-CM | POA: Insufficient documentation

## 2021-01-03 DIAGNOSIS — Z85828 Personal history of other malignant neoplasm of skin: Secondary | ICD-10-CM | POA: Diagnosis not present

## 2021-01-03 DIAGNOSIS — R059 Cough, unspecified: Secondary | ICD-10-CM | POA: Diagnosis not present

## 2021-01-03 DIAGNOSIS — R0602 Shortness of breath: Secondary | ICD-10-CM | POA: Diagnosis present

## 2021-01-03 LAB — CBC WITH DIFFERENTIAL/PLATELET
Abs Immature Granulocytes: 0.02 10*3/uL (ref 0.00–0.07)
Basophils Absolute: 0.1 10*3/uL (ref 0.0–0.1)
Basophils Relative: 1 %
Eosinophils Absolute: 0.3 10*3/uL (ref 0.0–0.5)
Eosinophils Relative: 4 %
HCT: 38.3 % — ABNORMAL LOW (ref 39.0–52.0)
Hemoglobin: 12.9 g/dL — ABNORMAL LOW (ref 13.0–17.0)
Immature Granulocytes: 0 %
Lymphocytes Relative: 39 %
Lymphs Abs: 2.6 10*3/uL (ref 0.7–4.0)
MCH: 32.1 pg (ref 26.0–34.0)
MCHC: 33.7 g/dL (ref 30.0–36.0)
MCV: 95.3 fL (ref 80.0–100.0)
Monocytes Absolute: 0.7 10*3/uL (ref 0.1–1.0)
Monocytes Relative: 10 %
Neutro Abs: 3.1 10*3/uL (ref 1.7–7.7)
Neutrophils Relative %: 46 %
Platelets: 228 10*3/uL (ref 150–400)
RBC: 4.02 MIL/uL — ABNORMAL LOW (ref 4.22–5.81)
RDW: 14.1 % (ref 11.5–15.5)
WBC: 6.7 10*3/uL (ref 4.0–10.5)
nRBC: 0 % (ref 0.0–0.2)

## 2021-01-03 LAB — COMPREHENSIVE METABOLIC PANEL
ALT: 15 U/L (ref 0–44)
AST: 29 U/L (ref 15–41)
Albumin: 4 g/dL (ref 3.5–5.0)
Alkaline Phosphatase: 54 U/L (ref 38–126)
Anion gap: 8 (ref 5–15)
BUN: 12 mg/dL (ref 8–23)
CO2: 30 mmol/L (ref 22–32)
Calcium: 9.2 mg/dL (ref 8.9–10.3)
Chloride: 102 mmol/L (ref 98–111)
Creatinine, Ser: 1.11 mg/dL (ref 0.61–1.24)
GFR, Estimated: >60 mL/min (ref >60)
Glucose, Bld: 81 mg/dL (ref 70–99)
Potassium: 3.6 mmol/L (ref 3.5–5.1)
Sodium: 140 mmol/L (ref 135–145)
Total Bilirubin: 0.5 mg/dL (ref 0.3–1.2)
Total Protein: 7.3 g/dL (ref 6.5–8.1)

## 2021-01-03 LAB — TROPONIN I (HIGH SENSITIVITY): Troponin I (High Sensitivity): 9 ng/L (ref <18)

## 2021-01-03 LAB — BRAIN NATRIURETIC PEPTIDE: B Natriuretic Peptide: 322.6 pg/mL — ABNORMAL HIGH (ref 0.0–100.0)

## 2021-01-03 NOTE — ED Notes (Signed)
XR at bedside

## 2021-01-03 NOTE — ED Notes (Signed)
Pt anxious to leave. Pt trying to leave with cords attached to him and IV still in. This nurse tried to talk him in to staying, but he is adamant to leave so IV removed and provider notified.

## 2021-01-03 NOTE — ED Triage Notes (Signed)
Pt cme in POV with c/o SOB on exertion. Cold sx for day and a half. Pt has had cold exposure due to no heat in home for two weeks. Pt insistent that he has to leave soon. Saturations 99-100%

## 2021-01-03 NOTE — ED Provider Notes (Signed)
[facility] DEPARTMENT Provider Note   CSN: [id number] Arrival date & time: 01/03/21  2256     History Chief Complaint  Patient presents with   Shortness of Breath    Casey Moreno is a 80 y.o. male.  The history is provided by the patient.  Shortness of Breath Severity:  Mild Onset quality:  Gradual Progression:  Unchanged Chronicity:  New Context: URI   Relieved by:  Nothing Worsened by:  Nothing Associated symptoms: cough   Associated symptoms: no abdominal pain, no chest pain, no claudication, no ear pain, no fever, no rash, no sore throat, no sputum production and no vomiting       Past Medical History:  Diagnosis Date   Arthritis    Cancer ([place])    COPD (chronic obstructive pulmonary disease) ([place])    Diverticulosis    Hyperlipidemia    Hypertension    Kidney stones    Osteoarthritis    Renal disorder    Skin cancer     There are no problems to display for this patient.   Past Surgical History:  Procedure Laterality Date   HERNIA REPAIR     PROSTATE ABLATION     SKIN BIOPSY     SKIN GRAFT         Family History  Problem Relation Age of Onset   Asthma Mother     Social History   Tobacco Use   Smoking status: Never   Smokeless tobacco: Never  Vaping Use   Vaping Use: Never used  Substance Use Topics   Alcohol use: Not Currently   Drug use: No    Home Medications Prior to Admission medications   Medication Sig Start Date End Date Taking? Authorizing Provider  albuterol (PROVENTIL HFA;VENTOLIN HFA) 108 (90 BASE) MCG/ACT inhaler Inhale 2 puffs into the lungs every 4 (four) hours as needed for wheezing or shortness of breath.    [provider]  amLODipine (NORVASC) 5 MG tablet Take 5 mg by mouth daily.    [provider]  atorvastatin (LIPITOR) 40 MG tablet Take 40 mg by mouth daily.    [provider]  azithromycin (ZITHROMAX Z-PAK) 250 MG tablet Take 1 tablet (250 mg total) by mouth  daily. Take as directed 05/07/17   Lajean Saver, MD  buPROPion (WELLBUTRIN) 100 MG tablet Take 30 mg by mouth once.    [provider]  carvedilol (COREG) 3.125 MG tablet Take 3.125 mg by mouth 2 (two) times daily with a meal.    [provider]  furosemide (LASIX) 40 MG tablet Take 1 tablet (40 mg total) by mouth daily. 03/18/33   Delora Fuel, MD  HYDROcodone-acetaminophen (NORCO/VICODIN) 5-325 MG tablet Take 1-2 tablets by mouth every 6 (six) hours as needed. 03/09/16   Ward, Delice Bison, DO  lidocaine (XYLOCAINE) 5 % ointment Apply 1 application topically as needed. 06/13/18   Long, Wonda Olds, MD  lubiprostone (AMITIZA) 8 MCG capsule Take 5 mcg by mouth 2 (two) times daily with a meal.    [provider]  nitroGLYCERIN (NITROSTAT) 0.4 MG SL tablet Place 0.4 mg under the tongue every 5 (five) minutes as needed for chest pain.    [provider]  omeprazole (PRILOSEC) 40 MG capsule Take 40 mg by mouth daily.    [provider]  ondansetron (ZOFRAN ODT) 4 MG disintegrating tablet Take 1 tablet (4 mg total) by mouth every 8 (eight) hours as needed for nausea or vomiting. 03/09/16  Ward, Delice Bison, DO  oxyCODONE-acetaminophen (PERCOCET) 10-325 MG tablet Take 1 tablet by mouth 3 (three) times daily after meals.    [provider]  predniSONE (DELTASONE) 20 MG tablet Take 3 tablets (60 mg total) by mouth daily. 05/07/17   Lajean Saver, MD  Promethazine HCl (PHENERGAN PO) Take by mouth.    [provider]  tamsulosin (FLOMAX) 0.4 MG CAPS capsule Take 0.4 mg by mouth daily.    [provider]    Allergies    Pneumococcal vaccines, Sulfa antibiotics, and Aspirin  Review of Systems   Review of Systems  Constitutional:  Negative for chills and fever.  HENT:  Negative for ear pain and sore throat.   Eyes:  Negative for pain and visual disturbance.  Respiratory:  Positive for cough and shortness of breath. Negative for sputum production.    Cardiovascular:  Negative for chest pain, palpitations and claudication.  Gastrointestinal:  Negative for abdominal pain and vomiting.  Genitourinary:  Negative for dysuria and hematuria.  Musculoskeletal:  Negative for arthralgias and back pain.  Skin:  Negative for color change and rash.  Neurological:  Negative for seizures and syncope.  All other systems reviewed and are negative.  Physical Exam Updated Vital Signs Wt 74.4 kg    BMI 21.64 kg/m   Physical Exam Vitals and nursing note reviewed.  Constitutional:      General: He is not in acute distress.    Appearance: He is well-developed.  HENT:     Head: Normocephalic and atraumatic.  Eyes:     Extraocular Movements: Extraocular movements intact.     Conjunctiva/sclera: Conjunctivae normal.     Pupils: Pupils are equal, round, and reactive to light.  Cardiovascular:     Rate and Rhythm: Normal rate and regular rhythm.     Heart sounds: No murmur heard. Pulmonary:     Effort: Pulmonary effort is normal. No respiratory distress.     Breath sounds: Wheezing present. No decreased breath sounds.  Abdominal:     Palpations: Abdomen is soft.     Tenderness: There is no abdominal tenderness.  Musculoskeletal:        General: No swelling.     Cervical back: Normal range of motion and neck supple.     Right lower leg: No edema.     Left lower leg: No edema.  Skin:    General: Skin is warm and dry.     Capillary Refill: Capillary refill takes less than 2 seconds.  Neurological:     Mental Status: He is alert.  Psychiatric:        Mood and Affect: Mood normal.    ED Results / Procedures / Treatments   Labs (all labs ordered are listed, but only abnormal results are displayed) Labs Reviewed  CBC WITH DIFFERENTIAL/PLATELET - Abnormal; Notable for the following components:      Result Value   RBC 4.02 (*)    Hemoglobin 12.9 (*)    HCT 38.3 (*)    All other components within normal limits  BRAIN NATRIURETIC PEPTIDE -  Abnormal; Notable for the following components:   B Natriuretic Peptide 322.6 (*)    All other components within normal limits  COMPREHENSIVE METABOLIC PANEL  TROPONIN I (HIGH SENSITIVITY)    EKG EKG Interpretation  Date/Time:  Sunday January 03 2021 23:07:46 EST Ventricular Rate:  86 PR Interval:    QRS Duration: 107 QT Interval:  372 QTC Calculation: 445 R Axis:   -56 Text  Interpretation: Atrial fibrillation LAD, 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 Lungs are well aerated bilaterally. No focal infiltrate or sizable effusion is seen. No bony abnormality is noted. IMPRESSION: No active disease. Electronically Signed   By: Inez Catalina M.D.   On: 01/03/2021 23:21    Procedures Procedures   Medications Ordered in ED Medications - No data to display  ED Course  I have reviewed the triage vital signs and the nursing notes.  Pertinent labs & imaging results that were available during my care of the patient were reviewed by me and considered in my medical decision making (see chart for details).    MDM Rules/Calculators/A&P                          Casey Moreno is a 80 year old male with history of hypertension, high cholesterol, COPD presents the ED with shortness of breath.  Has had cough and shortness of breath.  Denies any chest pain, abdominal pain.  History of A. fib as well but not on blood thinners.  He is in rate controlled A. fib upon arrival here.  No fever.  Normal vitals otherwise.  No hypoxia.  Normal work of breathing.  No real diminished breath sounds or wheezing.  No signs of volume overload.  Patient still does not want to be on blood thinners for his  A. fib and will continue to talk to his primary care doctor about this.  I have reviewed prior notes about this as well and at this time he does not want anticoagulation.  Lab work was obtained but patient eloped prior to lab work being resulted.  However chest x-ray showed no obvious pneumonia.  Troponin and BNP were overall unremarkable.  No significant anemia or electrolyte abnormality.  He did not want to be tested for COVID or flu but overall suspect viral process or mild COPD exacerbation.  This chart was dictated using voice recognition software.  Despite best efforts to proofread,  errors can occur which can change the documentation meaning.      Final Clinical Impression(s) / ED Diagnoses Final diagnoses:  SOB (shortness of breath)    Rx / DC Orders ED Discharge Orders     None        Lennice Sites, DO 01/04/21 [id number]

## 2021-01-03 NOTE — ED Notes (Signed)
Pt refused resp panel swab. MD aware.

## 2021-01-04 NOTE — ED Notes (Signed)
Pt refusing to stay, requesting leads and IV to be removed and WC out to his vehicle. EDP aware.

## 2021-06-17 DEATH — deceased
